# Patient Record
Sex: Female | Born: 1935 | Race: White | Hispanic: No | Smoking: Never smoker
Health system: Southern US, Community
[De-identification: ages and names within clinical notes are randomized; demographics above are authoritative.]

## PROBLEM LIST (undated history)

## (undated) DIAGNOSIS — M419 Scoliosis, unspecified: Secondary | ICD-10-CM

## (undated) DIAGNOSIS — I779 Disorder of arteries and arterioles, unspecified: Secondary | ICD-10-CM

## (undated) DIAGNOSIS — E785 Hyperlipidemia, unspecified: Secondary | ICD-10-CM

## (undated) DIAGNOSIS — I350 Nonrheumatic aortic (valve) stenosis: Secondary | ICD-10-CM

## (undated) DIAGNOSIS — E039 Hypothyroidism, unspecified: Secondary | ICD-10-CM

## (undated) DIAGNOSIS — K219 Gastro-esophageal reflux disease without esophagitis: Secondary | ICD-10-CM

## (undated) DIAGNOSIS — I2510 Atherosclerotic heart disease of native coronary artery without angina pectoris: Secondary | ICD-10-CM

## (undated) DIAGNOSIS — I480 Paroxysmal atrial fibrillation: Secondary | ICD-10-CM

## (undated) DIAGNOSIS — Z87442 Personal history of urinary calculi: Secondary | ICD-10-CM

## (undated) DIAGNOSIS — I10 Essential (primary) hypertension: Secondary | ICD-10-CM

## (undated) DIAGNOSIS — I5032 Chronic diastolic (congestive) heart failure: Secondary | ICD-10-CM

## (undated) HISTORY — DX: Scoliosis, unspecified: M41.9

## (undated) HISTORY — DX: Atherosclerotic heart disease of native coronary artery without angina pectoris: I25.10

## (undated) HISTORY — DX: Paroxysmal atrial fibrillation: I48.0

## (undated) HISTORY — DX: Personal history of urinary calculi: Z87.442

## (undated) HISTORY — DX: Essential (primary) hypertension: I10

## (undated) HISTORY — DX: Nonrheumatic aortic (valve) stenosis: I35.0

## (undated) HISTORY — PX: CATARACT EXTRACTION: SUR2

## (undated) HISTORY — PX: OTHER SURGICAL HISTORY: SHX169

## (undated) HISTORY — DX: Hyperlipidemia, unspecified: E78.5

## (undated) HISTORY — DX: Chronic diastolic (congestive) heart failure: I50.32

## (undated) HISTORY — DX: Disorder of arteries and arterioles, unspecified: I77.9

## (undated) HISTORY — DX: Hypothyroidism, unspecified: E03.9

## (undated) HISTORY — DX: Gastro-esophageal reflux disease without esophagitis: K21.9

---

## 2004-07-28 ENCOUNTER — Ambulatory Visit: Payer: Self-pay | Admitting: Family Medicine

## 2007-01-17 ENCOUNTER — Ambulatory Visit: Payer: Self-pay | Admitting: Family Medicine

## 2007-01-18 ENCOUNTER — Ambulatory Visit: Payer: Self-pay | Admitting: Family Medicine

## 2007-02-15 ENCOUNTER — Ambulatory Visit: Payer: Self-pay | Admitting: Vascular Surgery

## 2008-07-11 ENCOUNTER — Ambulatory Visit: Payer: Self-pay | Admitting: Family Medicine

## 2009-06-28 ENCOUNTER — Inpatient Hospital Stay: Payer: Self-pay | Admitting: Internal Medicine

## 2010-01-15 ENCOUNTER — Ambulatory Visit: Payer: Self-pay | Admitting: Family Medicine

## 2010-01-26 ENCOUNTER — Ambulatory Visit: Payer: Self-pay | Admitting: Family Medicine

## 2010-02-26 ENCOUNTER — Ambulatory Visit: Payer: Self-pay | Admitting: Surgery

## 2010-03-02 ENCOUNTER — Ambulatory Visit: Payer: Self-pay | Admitting: Surgery

## 2010-03-17 ENCOUNTER — Ambulatory Visit: Payer: Self-pay | Admitting: Surgery

## 2010-03-27 ENCOUNTER — Ambulatory Visit: Payer: Self-pay | Admitting: Oncology

## 2010-04-13 ENCOUNTER — Ambulatory Visit: Payer: Self-pay | Admitting: Oncology

## 2010-04-27 ENCOUNTER — Ambulatory Visit: Payer: Self-pay | Admitting: Oncology

## 2010-05-28 ENCOUNTER — Ambulatory Visit: Payer: Self-pay | Admitting: Oncology

## 2010-06-27 ENCOUNTER — Ambulatory Visit: Payer: Self-pay | Admitting: Oncology

## 2010-07-28 ENCOUNTER — Ambulatory Visit: Payer: Self-pay | Admitting: Oncology

## 2010-08-27 ENCOUNTER — Ambulatory Visit: Payer: Self-pay | Admitting: Oncology

## 2011-01-19 ENCOUNTER — Ambulatory Visit: Payer: Self-pay | Admitting: Family Medicine

## 2012-03-07 ENCOUNTER — Ambulatory Visit: Payer: Self-pay | Admitting: Family Medicine

## 2013-03-12 ENCOUNTER — Ambulatory Visit: Payer: Self-pay | Admitting: Family Medicine

## 2013-03-21 ENCOUNTER — Ambulatory Visit: Payer: Self-pay

## 2013-12-12 ENCOUNTER — Ambulatory Visit: Payer: Self-pay | Admitting: Urology

## 2014-01-01 ENCOUNTER — Emergency Department: Payer: Self-pay | Admitting: Emergency Medicine

## 2014-01-01 LAB — BASIC METABOLIC PANEL
Anion Gap: 6 — ABNORMAL LOW (ref 7–16)
BUN: 15 mg/dL (ref 7–18)
CALCIUM: 8 mg/dL — AB (ref 8.5–10.1)
CHLORIDE: 97 mmol/L — AB (ref 98–107)
CREATININE: 0.92 mg/dL (ref 0.60–1.30)
Co2: 27 mmol/L (ref 21–32)
EGFR (African American): >60
EGFR (Non-African Amer.): 58 — ABNORMAL LOW
Glucose: 108 mg/dL — ABNORMAL HIGH (ref 65–99)
OSMOLALITY: 262 (ref 275–301)
Potassium: 3.2 mmol/L — ABNORMAL LOW (ref 3.5–5.1)
SODIUM: 130 mmol/L — AB (ref 136–145)

## 2014-01-01 LAB — CBC
HCT: 36.9 % (ref 35.0–47.0)
HGB: 12.7 g/dL (ref 12.0–16.0)
MCH: 30.7 pg (ref 26.0–34.0)
MCHC: 34.5 g/dL (ref 32.0–36.0)
MCV: 89 fL (ref 80–100)
Platelet: 115 10*3/uL — ABNORMAL LOW (ref 150–440)
RBC: 4.15 10*6/uL (ref 3.80–5.20)
RDW: 13.5 % (ref 11.5–14.5)
WBC: 5.2 10*3/uL (ref 3.6–11.0)

## 2014-01-01 LAB — TSH: Thyroid Stimulating Horm: 2.25 u[IU]/mL

## 2014-01-31 DIAGNOSIS — E039 Hypothyroidism, unspecified: Secondary | ICD-10-CM | POA: Insufficient documentation

## 2014-01-31 DIAGNOSIS — E782 Mixed hyperlipidemia: Secondary | ICD-10-CM | POA: Insufficient documentation

## 2014-01-31 DIAGNOSIS — I10 Essential (primary) hypertension: Secondary | ICD-10-CM | POA: Insufficient documentation

## 2014-02-03 ENCOUNTER — Inpatient Hospital Stay: Payer: Self-pay | Admitting: Internal Medicine

## 2014-02-03 LAB — COMPREHENSIVE METABOLIC PANEL
ALBUMIN: 3.1 g/dL — AB (ref 3.4–5.0)
ALT: 115 U/L — AB (ref 12–78)
Alkaline Phosphatase: 71 U/L
Anion Gap: 3 — ABNORMAL LOW (ref 7–16)
BUN: 11 mg/dL (ref 7–18)
Bilirubin,Total: 0.6 mg/dL (ref 0.2–1.0)
CALCIUM: 8.8 mg/dL (ref 8.5–10.1)
Chloride: 97 mmol/L — ABNORMAL LOW (ref 98–107)
Co2: 31 mmol/L (ref 21–32)
Creatinine: 0.43 mg/dL — ABNORMAL LOW (ref 0.60–1.30)
EGFR (African American): >60
EGFR (Non-African Amer.): >60
GLUCOSE: 133 mg/dL — AB (ref 65–99)
Osmolality: 264 (ref 275–301)
POTASSIUM: 4.5 mmol/L (ref 3.5–5.1)
SGOT(AST): 96 U/L — ABNORMAL HIGH (ref 15–37)
SODIUM: 131 mmol/L — AB (ref 136–145)
TOTAL PROTEIN: 6.9 g/dL (ref 6.4–8.2)

## 2014-02-03 LAB — PROTIME-INR
INR: 0.9
PROTHROMBIN TIME: 11.6 s (ref 11.5–14.7)

## 2014-02-03 LAB — TROPONIN I: Troponin-I: <0.02 ng/mL

## 2014-02-03 LAB — CK TOTAL AND CKMB (NOT AT ARMC)
CK, Total: 71 U/L
CK-MB: 1.2 ng/mL (ref 0.5–3.6)

## 2014-02-03 LAB — CBC
HCT: 41.7 % (ref 35.0–47.0)
HGB: 13.8 g/dL (ref 12.0–16.0)
MCH: 30.6 pg (ref 26.0–34.0)
MCHC: 33.2 g/dL (ref 32.0–36.0)
MCV: 92 fL (ref 80–100)
Platelet: 220 10*3/uL (ref 150–440)
RBC: 4.51 10*6/uL (ref 3.80–5.20)
RDW: 15.2 % — AB (ref 11.5–14.5)
WBC: 6.1 10*3/uL (ref 3.6–11.0)

## 2014-02-03 LAB — TSH: Thyroid Stimulating Horm: 3.91 u[IU]/mL

## 2014-02-03 LAB — PRO B NATRIURETIC PEPTIDE: B-Type Natriuretic Peptide: 7243 pg/mL — ABNORMAL HIGH (ref 0–450)

## 2014-02-03 LAB — APTT: Activated PTT: 25.2 secs (ref 23.6–35.9)

## 2014-02-03 LAB — CK-MB: CK-MB: 2 ng/mL (ref 0.5–3.6)

## 2014-02-04 LAB — CBC WITH DIFFERENTIAL/PLATELET
Basophil #: 0 10*3/uL (ref 0.0–0.1)
Basophil %: 0.1 %
EOS PCT: 0.4 %
Eosinophil #: 0 10*3/uL (ref 0.0–0.7)
HCT: 38.7 % (ref 35.0–47.0)
HGB: 12.8 g/dL (ref 12.0–16.0)
LYMPHS PCT: 6.9 %
Lymphocyte #: 0.4 10*3/uL — ABNORMAL LOW (ref 1.0–3.6)
MCH: 30.4 pg (ref 26.0–34.0)
MCHC: 33.1 g/dL (ref 32.0–36.0)
MCV: 92 fL (ref 80–100)
Monocyte #: 0.8 x10 3/mm (ref 0.2–0.9)
Monocyte %: 13.9 %
Neutrophil #: 4.7 10*3/uL (ref 1.4–6.5)
Neutrophil %: 78.7 %
PLATELETS: 178 10*3/uL (ref 150–440)
RBC: 4.22 10*6/uL (ref 3.80–5.20)
RDW: 15.3 % — ABNORMAL HIGH (ref 11.5–14.5)
WBC: 6 10*3/uL (ref 3.6–11.0)

## 2014-02-04 LAB — BASIC METABOLIC PANEL
Anion Gap: 7 (ref 7–16)
BUN: 12 mg/dL (ref 7–18)
CHLORIDE: 93 mmol/L — AB (ref 98–107)
CREATININE: 0.85 mg/dL (ref 0.60–1.30)
Calcium, Total: 9.5 mg/dL (ref 8.5–10.1)
Co2: 32 mmol/L (ref 21–32)
EGFR (African American): >60
EGFR (Non-African Amer.): >60
Glucose: 95 mg/dL (ref 65–99)
Osmolality: 264 (ref 275–301)
Potassium: 3.6 mmol/L (ref 3.5–5.1)
SODIUM: 132 mmol/L — AB (ref 136–145)

## 2014-02-04 LAB — TROPONIN I

## 2014-02-04 LAB — CK-MB: CK-MB: 2.8 ng/mL (ref 0.5–3.6)

## 2014-02-05 LAB — BASIC METABOLIC PANEL
ANION GAP: 7 (ref 7–16)
BUN: 14 mg/dL (ref 7–18)
CO2: 34 mmol/L — AB (ref 21–32)
Calcium, Total: 9 mg/dL (ref 8.5–10.1)
Chloride: 91 mmol/L — ABNORMAL LOW (ref 98–107)
Creatinine: 0.78 mg/dL (ref 0.60–1.30)
EGFR (African American): >60
EGFR (Non-African Amer.): >60
Glucose: 90 mg/dL (ref 65–99)
Osmolality: 265 (ref 275–301)
Potassium: 2.7 mmol/L — ABNORMAL LOW (ref 3.5–5.1)
Sodium: 132 mmol/L — ABNORMAL LOW (ref 136–145)

## 2014-02-05 LAB — MAGNESIUM
MAGNESIUM: 1.9 mg/dL
Magnesium: 1.8 mg/dL

## 2014-02-05 LAB — CBC WITH DIFFERENTIAL/PLATELET
BASOS PCT: 0.4 %
Basophil #: 0 10*3/uL (ref 0.0–0.1)
EOS PCT: 1.6 %
Eosinophil #: 0.1 10*3/uL (ref 0.0–0.7)
HCT: 37.2 % (ref 35.0–47.0)
HGB: 12.7 g/dL (ref 12.0–16.0)
Lymphocyte #: 0.4 10*3/uL — ABNORMAL LOW (ref 1.0–3.6)
Lymphocyte %: 8.4 %
MCH: 31.4 pg (ref 26.0–34.0)
MCHC: 34.2 g/dL (ref 32.0–36.0)
MCV: 92 fL (ref 80–100)
MONOS PCT: 14.8 %
Monocyte #: 0.7 x10 3/mm (ref 0.2–0.9)
NEUTROS PCT: 74.8 %
Neutrophil #: 3.7 10*3/uL (ref 1.4–6.5)
Platelet: 148 10*3/uL — ABNORMAL LOW (ref 150–440)
RBC: 4.05 10*6/uL (ref 3.80–5.20)
RDW: 15 % — ABNORMAL HIGH (ref 11.5–14.5)
WBC: 4.9 10*3/uL (ref 3.6–11.0)

## 2014-02-05 LAB — POTASSIUM: Potassium: 4 mmol/L (ref 3.5–5.1)

## 2014-02-05 LAB — HEMOGLOBIN A1C: HEMOGLOBIN A1C: 5.6 % (ref 4.2–6.3)

## 2014-02-06 LAB — BASIC METABOLIC PANEL
ANION GAP: 2 — AB (ref 7–16)
BUN: 14 mg/dL (ref 7–18)
Calcium, Total: 8.7 mg/dL (ref 8.5–10.1)
Chloride: 94 mmol/L — ABNORMAL LOW (ref 98–107)
Co2: 36 mmol/L — ABNORMAL HIGH (ref 21–32)
Creatinine: 0.88 mg/dL (ref 0.60–1.30)
EGFR (Non-African Amer.): >60
Glucose: 94 mg/dL (ref 65–99)
Osmolality: 265 (ref 275–301)
Potassium: 4.4 mmol/L (ref 3.5–5.1)
Sodium: 132 mmol/L — ABNORMAL LOW (ref 136–145)

## 2014-02-06 LAB — MAGNESIUM: Magnesium: 2.2 mg/dL

## 2014-02-07 LAB — CREATININE, SERUM
Creatinine: 0.89 mg/dL (ref 0.60–1.30)
EGFR (African American): >60
GFR CALC NON AF AMER: 60 — AB

## 2014-02-07 LAB — SODIUM: Sodium: 134 mmol/L — ABNORMAL LOW (ref 136–145)

## 2014-02-07 LAB — POTASSIUM: POTASSIUM: 4.4 mmol/L (ref 3.5–5.1)

## 2014-02-15 LAB — COMPREHENSIVE METABOLIC PANEL
ANION GAP: 4 — AB (ref 7–16)
Albumin: 3.2 g/dL — ABNORMAL LOW (ref 3.4–5.0)
Alkaline Phosphatase: 98 U/L
BUN: 16 mg/dL (ref 7–18)
Bilirubin,Total: 0.5 mg/dL (ref 0.2–1.0)
Calcium, Total: 9.3 mg/dL (ref 8.5–10.1)
Chloride: 102 mmol/L (ref 98–107)
Co2: 27 mmol/L (ref 21–32)
Creatinine: 0.78 mg/dL (ref 0.60–1.30)
Glucose: 149 mg/dL — ABNORMAL HIGH (ref 65–99)
Osmolality: 270 (ref 275–301)
Potassium: 4.6 mmol/L (ref 3.5–5.1)
SGOT(AST): 109 U/L — ABNORMAL HIGH (ref 15–37)
SGPT (ALT): 112 U/L — ABNORMAL HIGH (ref 12–78)
Sodium: 133 mmol/L — ABNORMAL LOW (ref 136–145)
Total Protein: 7.4 g/dL (ref 6.4–8.2)

## 2014-02-15 LAB — CBC
HCT: 42.3 % (ref 35.0–47.0)
HGB: 13.8 g/dL (ref 12.0–16.0)
MCH: 30.7 pg (ref 26.0–34.0)
MCHC: 32.6 g/dL (ref 32.0–36.0)
MCV: 94 fL (ref 80–100)
Platelet: 186 10*3/uL (ref 150–440)
RBC: 4.5 10*6/uL (ref 3.80–5.20)
RDW: 15 % — ABNORMAL HIGH (ref 11.5–14.5)
WBC: 6.6 10*3/uL (ref 3.6–11.0)

## 2014-02-15 LAB — TROPONIN I: Troponin-I: <0.02 ng/mL

## 2014-02-15 LAB — CK TOTAL AND CKMB (NOT AT ARMC)
CK, Total: 68 U/L
CK-MB: 1.4 ng/mL (ref 0.5–3.6)

## 2014-02-16 ENCOUNTER — Inpatient Hospital Stay: Payer: Self-pay | Admitting: Internal Medicine

## 2014-02-16 LAB — COMPREHENSIVE METABOLIC PANEL
ANION GAP: 3 — AB (ref 7–16)
Albumin: 3 g/dL — ABNORMAL LOW (ref 3.4–5.0)
Alkaline Phosphatase: 88 U/L
BUN: 15 mg/dL (ref 7–18)
Bilirubin,Total: 0.5 mg/dL (ref 0.2–1.0)
CREATININE: 0.84 mg/dL (ref 0.60–1.30)
Calcium, Total: 8.8 mg/dL (ref 8.5–10.1)
Chloride: 107 mmol/L (ref 98–107)
Co2: 32 mmol/L (ref 21–32)
EGFR (African American): >60
EGFR (Non-African Amer.): >60
GLUCOSE: 103 mg/dL — AB (ref 65–99)
OSMOLALITY: 284 (ref 275–301)
Potassium: 3.9 mmol/L (ref 3.5–5.1)
SGOT(AST): 68 U/L — ABNORMAL HIGH (ref 15–37)
SGPT (ALT): 99 U/L — ABNORMAL HIGH (ref 12–78)
SODIUM: 142 mmol/L (ref 136–145)
TOTAL PROTEIN: 5.9 g/dL — AB (ref 6.4–8.2)

## 2014-02-16 LAB — TROPONIN I
Troponin-I: <0.02 ng/mL
Troponin-I: <0.02 ng/mL

## 2014-02-16 LAB — URINALYSIS, COMPLETE
BLOOD: NEGATIVE
Bacteria: NONE SEEN
Bilirubin,UR: NEGATIVE
GLUCOSE, UR: NEGATIVE mg/dL (ref 0–75)
KETONE: NEGATIVE
LEUKOCYTE ESTERASE: NEGATIVE
NITRITE: NEGATIVE
Ph: 7 (ref 4.5–8.0)
RBC,UR: 1 /HPF (ref 0–5)
SQUAMOUS EPITHELIAL: NONE SEEN
Specific Gravity: 1.009 (ref 1.003–1.030)

## 2014-02-16 LAB — CK TOTAL AND CKMB (NOT AT ARMC)
CK, Total: 26 U/L
CK, Total: 14 U/L — ABNORMAL LOW
CK-MB: 1.6 ng/mL (ref 0.5–3.6)
CK-MB: 1.1 ng/mL (ref 0.5–3.6)

## 2014-02-16 LAB — PRO B NATRIURETIC PEPTIDE: B-TYPE NATIURETIC PEPTID: 9758 pg/mL — AB (ref 0–450)

## 2014-02-16 LAB — HEMOGLOBIN A1C: Hemoglobin A1C: 5.8 % (ref 4.2–6.3)

## 2014-02-17 LAB — CBC WITH DIFFERENTIAL/PLATELET
BASOS PCT: 0.7 %
Basophil #: 0 10*3/uL (ref 0.0–0.1)
Eosinophil #: 0.1 10*3/uL (ref 0.0–0.7)
Eosinophil %: 2.6 %
HCT: 37.6 % (ref 35.0–47.0)
HGB: 12.4 g/dL (ref 12.0–16.0)
Lymphocyte #: 0.5 10*3/uL — ABNORMAL LOW (ref 1.0–3.6)
Lymphocyte %: 12.3 %
MCH: 30.6 pg (ref 26.0–34.0)
MCHC: 33 g/dL (ref 32.0–36.0)
MCV: 93 fL (ref 80–100)
MONO ABS: 0.7 x10 3/mm (ref 0.2–0.9)
Monocyte %: 14.8 %
NEUTROS ABS: 3.1 10*3/uL (ref 1.4–6.5)
NEUTROS PCT: 69.6 %
Platelet: 168 10*3/uL (ref 150–440)
RBC: 4.05 10*6/uL (ref 3.80–5.20)
RDW: 14.9 % — ABNORMAL HIGH (ref 11.5–14.5)
WBC: 4.4 10*3/uL (ref 3.6–11.0)

## 2014-02-17 LAB — COMPREHENSIVE METABOLIC PANEL
ALBUMIN: 2.6 g/dL — AB (ref 3.4–5.0)
ALT: 73 U/L (ref 12–78)
AST: 42 U/L — AB (ref 15–37)
Alkaline Phosphatase: 74 U/L
Anion Gap: 3 — ABNORMAL LOW (ref 7–16)
BUN: 16 mg/dL (ref 7–18)
Bilirubin,Total: 0.6 mg/dL (ref 0.2–1.0)
CHLORIDE: 103 mmol/L (ref 98–107)
Calcium, Total: 8.9 mg/dL (ref 8.5–10.1)
Co2: 33 mmol/L — ABNORMAL HIGH (ref 21–32)
Creatinine: 0.82 mg/dL (ref 0.60–1.30)
EGFR (African American): >60
EGFR (Non-African Amer.): >60
GLUCOSE: 80 mg/dL (ref 65–99)
Osmolality: 278 (ref 275–301)
Potassium: 3.6 mmol/L (ref 3.5–5.1)
SODIUM: 139 mmol/L (ref 136–145)
Total Protein: 5.9 g/dL — ABNORMAL LOW (ref 6.4–8.2)

## 2014-02-17 LAB — D-DIMER(ARMC): D-Dimer: 1223 ng/ml

## 2014-02-17 LAB — URINE CULTURE

## 2014-02-17 LAB — LIPID PANEL
Cholesterol: 168 mg/dL (ref 0–200)
HDL: 40 mg/dL (ref 40–60)
LDL CHOLESTEROL, CALC: 109 mg/dL — AB (ref 0–100)
Triglycerides: 97 mg/dL (ref 0–200)
VLDL Cholesterol, Calc: 19 mg/dL (ref 5–40)

## 2014-02-20 DIAGNOSIS — I2510 Atherosclerotic heart disease of native coronary artery without angina pectoris: Secondary | ICD-10-CM | POA: Insufficient documentation

## 2014-09-11 ENCOUNTER — Ambulatory Visit: Payer: Self-pay | Admitting: Family Medicine

## 2014-10-16 DIAGNOSIS — I482 Chronic atrial fibrillation, unspecified: Secondary | ICD-10-CM | POA: Insufficient documentation

## 2014-10-16 DIAGNOSIS — I429 Cardiomyopathy, unspecified: Secondary | ICD-10-CM | POA: Insufficient documentation

## 2014-10-16 DIAGNOSIS — I071 Rheumatic tricuspid insufficiency: Secondary | ICD-10-CM | POA: Insufficient documentation

## 2014-10-16 DIAGNOSIS — I340 Nonrheumatic mitral (valve) insufficiency: Secondary | ICD-10-CM | POA: Insufficient documentation

## 2014-10-16 DIAGNOSIS — I119 Hypertensive heart disease without heart failure: Secondary | ICD-10-CM | POA: Insufficient documentation

## 2014-10-16 DIAGNOSIS — I272 Pulmonary hypertension, unspecified: Secondary | ICD-10-CM | POA: Insufficient documentation

## 2015-01-14 DIAGNOSIS — M810 Age-related osteoporosis without current pathological fracture: Secondary | ICD-10-CM | POA: Insufficient documentation

## 2015-01-18 NOTE — Discharge Summary (Signed)
PATIENT NAME:  April Barrett, April Barrett DATE OF BIRTH:  September 16, 1928  DATE OF ADMISSION:  02/03/2014 DATE OF DISCHARGE:  02/07/2014   ADMITTING DIAGNOSIS: Shortness of breath.   DISCHARGE DIAGNOSES:  1. Acute respiratory failure felt to be multifactorial, including acute systolic congestive heart failure, possible chronic obstructive pulmonary disease exacerbation, possible atypical pneumonia.  2. Acute on chronic systolic congestive heart failure.  3. Acute on chronic obstructive pulmonary disease exacerbation.  4. Possible pneumonia.  5. Hyponatremia due to diuresis.  6. Gastroesophageal reflux disease.  7. Hypothyroidism.  8. Hypertension.  9. Cataracts.  10. Coronary artery disease per cardiac catheterization in 2010, with severe disease, not amenable to stenting.  CONSULTANTS: Marcina MillardAlexander Paraschos, MD  PERTINENT LABORATORIES AND EVALUATIONS: Troponin less than 0.02, CPK 71, CK-MB 12. INR 0.9. WBC 6.1, hemoglobin 13.8, BUN 11, creatinine 0.43. Chest x-ray showed atelectasis with bilateral infiltrates. EKG: Atrial fibrillation with RVR. Most recent sodium is 134. Creatinine on May 13th is 0.88. Ultrasound of the lower extremities showed no DVT. Echocardiogram of the heart showed mildly decreased systolic function, 40% to 45%, mild left ventricular hypertrophy, mildly dilated left atrium, moderate mitral valve regurgitation, mild aortic stenosis, moderate tricuspid regurgitation, moderately elevated pulmonary systolic pressure.   HOSPITAL COURSE: Please refer to H and P done by the admitting physician. The patient is an 81 year old white female with history of atrial fibrillation, coronary artery disease, history of congestive heart failure, who presented with complaint of shortness of breath. Was noted to be in atrial fibrillation with RVR. The patient was treated for her atrial fibrillation with RVR, and subsequently, her heart rate improved. She was also noticed to have systolic  CHF, which was treated with diuresis. The patient was also thought to have possible pneumonia, which was treated with Levaquin. She also was noted to have some wheezing and thought to have acute COPD exacerbation, which was treated with nebulizers. The patient is doing much better. Was seen by cardiology during the hospitalization. At this point, she is hemodynamically stable for discharge.   DISCHARGE MEDICATIONS:  1. Diltiazem 120 mg daily.  2. Vitamin D3 1000 international units daily. 3. Tums 500 two tabs 4 times a day. 4. Klor-Con 10 mEq daily.  5. Metoprolol tartrate 100 mg 1 tab p.o. b.i.d. 6. Nexium 40 daily. 7. Levothyroxine 100 mcg daily for 6 days.  8. Vitamin B12 500 mcg daily.  9. Aspirin 81 one tab p.o. daily. 10. Colace 300 mg daily. 11. Ventolin 2 puffs 4 times a day as needed for wheezing.  12. Levothyroxine 100 mcg daily.  13. Levofloxacin 750 one tab p.o. every 48 hours for 6 days. 14. Lasix 20 one tab p.o. b.i.d.   HOME OXYGEN: Yes, continuous at 1 liter nasal cannula.   DIET: Low sodium, low fat, low cholesterol.   ACTIVITY: As tolerated.  FOLLOWUP: Timeframe for followup with primary MD in 1 to 2 weeks. [facility] in 2 to 4 weeks. The patient to have a BMP checked at the time of visit to her primary care provider. Also, the patient to wear TED hose for the lower extremity at all times.    TIME SPENT: 35 minutes.   ____________________________ Lacie ScottsShreyang H. Allena KatzPatel, MD shp:lb D: 02/07/2014 09:55:32 ET T: 02/07/2014 10:08:12 ET JOB#: [id number]  cc: Claiborne Stroble H. Allena KatzPatel, MD, <Dictator> Charise CarwinSHREYANG H Natividad Halls MD ELECTRONICALLY SIGNED 02/08/2014 8:32

## 2015-01-18 NOTE — Discharge Summary (Signed)
PATIENT NAME:  April Barrett, April Barrett MR#:  [id number] DATE OF BIRTH:  [birth year]  DATE OF ADMISSION:  02/16/2014 DATE OF DISCHARGE:  02/18/2014  ADMITTING DIAGNOSES: Atrial fibrillation, rapid ventricular response, congestive heart failure.   DISCHARGE DIAGNOSES:  1.  Acute on chronic combined systolic and diastolic congestive heart failure.  2.  Atrial fibrillation, rapid ventricular response.  3.  Elevated transaminases, likely passive liver congestion, improved with congestive heart failure treatment. 4.  History of chronic obstructive pulmonary disease, stable.  5.  Coronary artery disease, stable.  6.  Chronic atrial fibrillation. 7.  Hypothyroidism. 8.  Hypertension.  9.  Cataracts. 10.  Gastroesophageal reflux disease. 11.  Known cardiomyopathy with ejection fraction of 40% to 45% with moderate left ventricular hypertrophy, moderate tricuspid regurgitation, as well as mitral regurgitation, and mild aortic regurgitation and elevated pulmonary pressures. 12.  Acute respiratory failure due to acute on chronic combined congestive heart failure requiring oxygen therapy.   DISCHARGE CONDITION: Stable.   DISCHARGE MEDICATIONS: The patient is to resume:  1.  Vitamin D3 1000 units once daily. 2.  TUMS 500 mg 2 tablets 4 times daily. 3.  Metoprolol tartrate 100 mg p.o. twice daily. 4.  Nexium 40 mg p.o. once daily. 5.  Levothyroxine 100 mcg p.o. daily. 6.  Vitamin B12 500 mcg p.o. daily.  7.  Aspirin 81 mg p.o. daily. 8.  Docusate sodium 100 mg 3 capsules once daily.  9.  Ventolin CFC free 2 puffs 4 times daily as needed.  10.  Diltiazem extended release 180 mg p.o. once daily, this is a new dose. 11.  Diltiazem short-acting 30 mg p.o. every 6 hours as needed for heart rate more than 100 and to be held for systolic blood pressure below 045100.  12.  Furosemide 40 mg p.o. twice daily. 13.  Potassium chloride 20 mEq twice daily. 14.  Benzocaine menthol topical lozenges every 2 hours as needed  for cough as well as sore throat and poor taste in mouth.  HOME OXYGEN: Portable tank at 2 liters of oxygen through nasal cannula to be weaned as tolerated following oxygen saturations on exertion.  DISCHARGE DIET: Two gram salt, low fat, low cholesterol, regular consistency.   ACTIVITY LIMITATIONS: As tolerated.   FOLLOWUP APPOINTMENT: With Dr. Burnett ShengHedrick in 2 days after discharge, Dr. Lady GaryFath in 1 week after discharge. The patient is being referred to physical therapy in skilled nursing facility and being discharged to skilled nursing facility for oxygen wean.   CONSULTANTS: Care management, social work, Dr. Lady GaryFath.   DIAGNOSTIC DATA: Radiologic studies: Chest x-ray, PA and lateral, 23rd of May 2015, revealed improving pleural effusions, persistent pulmonary edema. CT angiogram of chest to rule out pulmonary embolism, 24th of May 2015, revealed no evidence of pulmonary embolus, combination of mild cardiomegaly, moderate right as well as small left pleural effusions and interstitial thickening with hazy areas of relative increased parenchymal attenuation consistent with congestive heart failure with mild pulmonary edema. There is also bibasilar dependent atelectasis. Ultrasound of abdomen, limited survey, 24th of May 2015, showed no definite abnormality seen in the right upper quadrant of the abdomen. Incidental note was made of right pleural effusion.   HISTORY AND HOSPITAL COURSE: The patient is an 81 year old Caucasian female with past medical history significant for history of chronic A-fib who presented to the hospital on the 23rd of May 2015 for shortness of breath as well as 1 pound weight gain in 1 day as well as increased heart rate. Please refer  to Dr. Rob Hickman admission note on the 23rd of May 2015. On arrival to the hospital, the patient'Barrett heart rate was around 140. She was also noted to have worsening pulmonary edema. Her vital signs: Temperature was 97.5, pulse was 104 to 140, respiration rate  was initially 22, blood pressure 125/62, and O2 sats were 96% on oxygen therapy. Physical exam revealed 1+ pitting lower extremity edema, distant breath sounds and rales at bases on lung exam. The patient'Barrett EKG showed A-fib with rapid ventricular response and nonspecific ST-T changes. The patient'Barrett lab data done on the day of admission revealed elevated beta-type natriuretic peptide of 9758 and glucose 149, otherwise, BMP was unremarkable. The patient'Barrett liver enzymes showed albumin level of 3.2, AST as well as ALT 109 and 112, respectively. Cardiac enzymes x3 were within normal limits and CBC was within normal limits with white blood cell count 6.6, hemoglobin 13.8, and platelet count 186,000. Urinalysis was performed and urine culture which showed no growth and urinalysis was unremarkable.   The patient was admitted to the hospital for further evaluation. She was restarted on her medications to control heart rate. She was initiated on Cardizem adding Cardizem IV as needed as well as metoprolol. With this combination her heart rate somewhat improved, however not significantly. The patient'Barrett Cardizem was advanced to 180 mg p.o. daily dose. With this combination, her heart rate improved to 80s and 100s. It was felt that the patient'Barrett control was quite satisfactory with this combination. The patient was diuresed with IV Lasix and her IV Lasix was changed to oral upon discharge. It is recommended to follow the patient'Barrett weight as well as her lung condition as well as her oxygenation closely and make decisions about advancement or decreasing her medications depending on her needs. The patient is to follow up with cardiologist as outpatient for further recommendations.   In regards to COPD, coronary artery disease, hypothyroidism, and hypertension the patient is to continue her outpatient management. No changes were made.   The patient is being discharged in stable condition with the above-mentioned medications and  follow-up. On the day of discharge, temperature was 98, pulse was 79, respiratory rate was 8 to 16, blood pressure 130/69, and saturation 96% on room air at rest.   TIME SPENT: 40 minutes.   ____________________________ Katharina Caper, MD rv:sb D: 02/18/2014 10:22:55 ET T: 02/18/2014 10:53:48 ET JOB#: [id number]  cc: Katharina Caper, MD, <Dictator> Jashon Ishida MD ELECTRONICALLY SIGNED 02/28/2014 19:59

## 2015-01-18 NOTE — Consult Note (Signed)
PATIENT NAME:  April Barrett, April Barrett MR#:  [id number] DATE OF BIRTH:  [birth year]  DATE OF CONSULTATION:  02/03/2014  REFERRING PHYSICIAN:   CONSULTING PHYSICIAN:  Marcina MillardAlexander Elsye Mccollister, MD  PRIMARY CARE PHYSICIAN:  Dr. Burnett ShengHedrick   CARDIOLOGIST:  Dr. Gwen PoundsKowalski.   CHIEF COMPLAINT: Shortness of breath.   HISTORY OF PRESENT ILLNESS: The patient is an 81 year old female with history of coronary artery disease, diastolic congestive heart failure, atrial fibrillation, hypertension. The patient presents with a 10-day history of increasing peripheral edema and dyspnea on exertion. In the Emergency Room, she was noted to be in atrial fibrillation with a rapid ventricular rate and was treated with intravenous diltiazem bolus. The patient was also treated with intravenous furosemide with diuresis and overall clinical improvement. Admission labs were notable for negative troponin of less than 0.02. The patient reports feeling much better today. Denies chest pain.   PAST MEDICAL HISTORY:  1. Coronary artery disease with both left main and right coronary artery disease, not felt to be amenable to percutaneous coronary intervention. 2. Diastolic congestive heart failure.  3. Atrial fibrillation.  4. Hypertension.  5. Gastroesophageal reflux disease.  6. Hypothyroidism.  7. History of cataracts.   MEDICATIONS: Metoprolol 100 mg daily, diltiazem 120 mg daily, aspirin 81 mg daily, Nexium 40 mg daily, Synthroid 100 mcg daily, vitamin D 1 daily, calcium 1 daily, vitamin B12 500 mg daily.   SOCIAL HISTORY: The patient currently lives by herself. She denies tobacco abuse.   FAMILY HISTORY: Positive for coronary artery disease.    REVIEW OF SYSTEMS:  CONSTITUTIONAL: No fever or chills.  EYES: No blurry vision.  EARS: No hearing loss.  RESPIRATORY: The patient has shortness of breath as described above.  CARDIOVASCULAR: The patient denies chest pain.  GASTROINTESTINAL: No nausea, vomiting, or diarrhea.   GENITOURINARY: No dysuria or hematuria.  ENDOCRINE: No polyuria or polydipsia.  INTEGUMENTARY: No rash.  MUSCULOSKELETAL: No arthralgias or myalgias.  NEUROLOGICAL: No focal muscle weakness or numbness.  PSYCHOLOGICAL: No depression or anxiety.   PHYSICAL EXAMINATION:  VITAL SIGNS: Blood pressure 159/93, pulse 86, respirations 20, temperature 97.7, pulse oximetry 98%.  HEENT: Pupils equal, reactive to light and accommodation.  NECK: Supple without thyromegaly.  LUNGS: Clear.  CARDIOVASCULAR: Normal JVP. Diffuse PMI. Regular rate and rhythm. Normal S1, S2. No appreciable gallop, murmur, or rub.  ABDOMEN: Soft, nontender. Pulses were intact bilaterally.  MUSCULOSKELETAL: Normal muscle tone.  NEUROLOGIC: The patient is alert and oriented x 3. Motor and sensory both grossly intact.   IMPRESSION: An 81 year old female with known coronary artery disease, diastolic congestive heart failure who presents with 10-day history of increasing shortness of breath and symptoms consistent with congestive heart failure. The patient has ruled out for myocardial infarction by CPK, isoenzymes, and troponin. Blood pressure is still mildly elevated. Patient has normal renal function. 1. Agree with overall current therapy.  2. Would defer full dose anticoagulation.  3. Would add a low-dose ACE inhibitor in light of elevated blood pressure, congestive heart failure, and normal renal function.  4. Review 2-D echocardiogram.  5. Further recommendations pending echocardiogram results.    ____________________________ Marcina MillardAlexander Corde Antonini, MD ap:dd D: 02/04/2014 17:42:04 ET T: 02/04/2014 19:33:39 ET JOB#: [id number]  cc: Marcina MillardAlexander Wilmoth Rasnic, MD, <Dictator> Marcina MillardALEXANDER Reise Gladney MD ELECTRONICALLY SIGNED 02/25/2014 15:03

## 2015-01-18 NOTE — H&P (Signed)
PATIENT NAME:  April Barrett, April Barrett MR#:  [id number] DATE OF BIRTH:  May 21, 1928  DATE OF ADMISSION:  02/16/2014  PRIMARY CARE PHYSICIAN: Dr. Tera MaterJim Hedrick   REFERRING PHYSICIAN: Emergency Room physician, Dr. Dolores FrameSung.   CHIEF COMPLAINT: Shortness of breath, 1 pound weight gain in one day and fast heart rate.  HISTORY OF PRESENT ILLNESS: The patient is an 81 year old, pleasant Caucasian female with a past medical history of congestive heart failure and multiple other medical problems, who is presenting to the ER with a chief complaint of shortness of breath. The patient was just recently admitted to the hospital on Feb 03, 2014, with a similar complaint of shortness of breath. The patient is sent over from a nursing home because of shortness of breath for two days. Denies any chest pain or tightness. Denies any wheezing. She reports that she has gained 1 pound of weight in a day. Complaining of swelling in her feet. Nursing home called EMS, and the patient was brought into the ER. The patient has chronic history of atrial fibrillation, and her heart rate was around 130s to 140s by the time she arrived. The patient was given Solu-Medrol 125 mg IV and has received Cardizem IV, which eventually decreased the heart rate to 110. The patient also has received Lasix IV by the ER physician, in view of pulmonary edema. During my examination, the patient is resting comfortably. Denies any complaints. No family members at bedside.    REVIEW OF SYSTEMS: CONSTITUTIONAL: Denies any fever, fatigue or weakness.  EYES: Denies blurry vision, double vision. ENT: Denies epistaxis, discharge. No tinnitus.  RESPIRATORY: Complaining of cough. No wheezing, hemoptysis. Complaining of dyspnea. No history of chronic obstructive pulmonary disease.  CARDIOVASCULAR: No chest pain. Has a chronic atrial fibrillation. Comes with palpitations. Denies any syncope.  GASTROINTESTINAL: Denies nausea, vomiting, diarrhea, abdominal pain, melena, or  peptic ulcer disease.  GENITOURINARY: No dysuria or hematuria.  ENDOCRINE: Denies polyuria, nocturia, heat or cold intolerance.  HEMATOLOGIC AND LYMPHATIC: Complaining of easy bruising. Denies any bleeding or anemia.  SKIN: No rashes. No lesions.  MUSCULOSKELETAL: No joint effusion. No redness, gout. No limited activity.  NEUROLOGIC: Denies any history of vertigo or ataxia.  PSYCHIATRIC: No history of anxiety or depression.   PAST MEDICAL HISTORY: Coronary artery disease. Had cardiac catheter in the year 2010, has revealed RCA and left main disease but not amenable for stents, congestive heart failure, diastolic, chronic atrial fibrillation, on baby aspirin, hypothyroidism, hypertension, cataracts, GERD, hypothyroidism.  PAST SURGICAL HISTORY: Cataract surgery, status post cardiac cath.   ALLERGIES: STATIN CAUSES SWELLING. ACE INHIBITOR, SWELLING. NORVASC, SWELLING.  PSYCHOSOCIAL HISTORY: Lives in a nursing home. Denies smoking, alcohol or illicit drug usage.   FAMILY HISTORY: Heart disease runs in her family.   HOME MEDICATIONS: Vitamin D3 1000 international units 1 tablet p.o. once daily, vitamin B12 500 mcg 1 tablet once daily, Ventolin 2 puffs inhalation 4 times a day, Tums 500 mg 4 times a day, Nexium 40 mg once daily, metoprolol tartrate 100 mg one p.o. b.i.d., levothyroxine 100 mcg p.o. once daily, levofloxacin p.o. q.48 h., Klor-Con 10 mg p.o. once daily, furosemide 20 mg p.o. 2 times a day, Colace 100 mg 1 capsule p.o. once a day, diltiazem 120 mg 1 capsule p.o. once daily.   PHYSICAL EXAMINATION: VITAL SIGNS: Temperature 97.5, pulse 101, respirations 22, blood pressure 125/62, pulse oximetry is 96%.  GENERAL APPEARANCE: Not in acute distress. Moderately built and nourished.  HEENT: Normocephalic, atraumatic. Pupils are equally reacting  to light and accommodation. No scleral icterus. No conjunctival injection. No sinus tenderness. No postnasal drip. Moist mucous membranes.  NECK:  Supple. No JVD. No thyromegaly. Range of motion is intact.  LUNGS: Moderate air entry. Distant breath sounds. Minimal rales at the bases.  CARDIOVASCULAR: Irregularly irregular. Tachycardic. No murmur.  GASTROINTESTINAL: Soft. Bowel sounds are positive in all four quadrants. Nontender, nondistended. No hepatosplenomegaly. No masses.  NEUROLOGIC: Awake, alert, oriented x3. Cranial nerves II through XII are intact. Motor and sensory are intact. Reflexes are 2+.  EXTREMITIES: 1+ pitting edema is present. No cyanosis. No clubbing.  SKIN: Warm to touch. Normal turgor. No rashes. No lesions.  MUSCULOSKELETAL: No joint effusion, tenderness, or erythema.  PSYCHIATRIC: Normal mood and affect.   LABORATORIES AND IMAGING STUDIES: CK total 68, CPK-MB 1.4. Troponin less than 0.02. CBC is normal. Urinalysis: Yellow in color, nitrites and leukocyte esterase are negative. LFTs: Albumin at 3.2, AST and ALT are elevated at 109 and 112, respectively. Glucose 149, BNP 9758. BUN and creatinine are normal. Sodium 133, potassium 4.6, chloride 102, CO2 of 27. GFR greater than 60. Anion gap 4. Serum osmolality and calcium are normal. EKG has revealed atrial fibrillation with rapid ventricular response. Portable chest x-ray, PA and lateral views, involving improving pleural effusions, persistent pulmonary edema.   ASSESSMENT AND PLAN: An 81 year old Caucasian female, brought into the Emergency Room with a chief complaint of shortness of breath, 1 pound weight gain in a day. Will be admitted with the following assessment and plan.   1.  Acute respiratory distress, probably from acute exacerbation of diastolic congestive heart failure and acute on chronic atrial fibrillation with rapid ventricular response. We will admit her to a telemetry bed, continue metoprolol, provide her IV Lasix. Cardiology consult is placed to Dr. Briscoe Burns group.  2.  Acute on chronic atrial fibrillation with rapid ventricular response. We will resume  her home medication, Cardizem. We will provide IV Cardizem as needed for heart rate greater than 120, and, if necessary, will up-titrate medications. We will provide baby aspirin for anticoagulation. 3.  History of chronic obstructive pulmonary disease, not in exacerbation. We will resume her home medication.  4.  Coronary artery disease. Continue beta blocker, statin and aspirin.  5.  Questionable diabetes mellitus. The patient denies any history of diabetes, not on any diabetic medications. Will check hemoglobin A1c.   CODE STATUS: Do not resuscitate.   Diagnosis and plan of care was discussed in detail with the patient. She is aware of the plan.  TOTAL TIME SPENT ON ADMISSION: 50 minutes.      ____________________________ Ramonita Lab, MD ag:cg D: 02/16/2014 02:24:05 ET T: 02/16/2014 04:20:58 ET JOB#: [id number]  cc: Ramonita Lab, MD, <Dictator> Marcina Millard, MD Rhona Leavens. Burnett Sheng, MD  Ramonita Lab MD ELECTRONICALLY SIGNED 02/28/2014 7:56

## 2015-01-18 NOTE — H&P (Signed)
PATIENT NAME:  April Barrett, April Barrett MR#:  [id number] DATE OF BIRTH:  Dec 30, 1927  DATE OF ADMISSION:  02/03/2014  PRIMARY CARE PHYSICIAN: Dr. Kary Kos.   PRIMARY CARDIOLOGIST:  Dr. Nehemiah Massed.    CHIEF COMPLAINT: Shortness of breath for the past 10 days.   HISTORY OF PRESENT ILLNESS: This is an 81 year old female who presents with shortness of breath for the past 10 days with increasing lower extremity edema and dyspnea on exertion. She was noted to be in atrial fibrillation/RVR. Received 10 of diltiazem. Her heart rate went down to 96. She was also given IV Lasix due to elevated BNP and signs of congestive heart failure.    REVIEW OF SYSTEMS:   CONSTITUTIONAL: No fever. Positive fatigue and weakness.  EYES:  No blurred or double vision.  ENT:  No ear pain, hearing. Positive seasonal allergies. No snoring.  RESPIRATORY: Positive cough. No wheezing, hemoptysis. Positive dyspnea. No history of COPD.  CARDIOVASCULAR: No chest pain. Positive orthopnea. Positive edema. Positive dyspnea on exertion. Positive palpitations. No syncope.  GASTROINTESTINAL: No nausea, vomiting, diarrhea, abdominal pain, melena or ulcers.  GENITOURINARY: No dysuria or hematuria.  ENDOCRINE: No polyuria or polydipsia.  HEMATOLOGIC/LYMPHATIC: Positive easy bruising.  SKIN: No rash or lesions.  MUSCULOSKELETAL: No redness or gout. No limited activity.  NEUROLOGIC: No history of CVA.  PSYCHIATRIC: No history of anxiety or depression.   PAST MEDICAL HISTORY:   1.  GERD.  2.  Hypothyroidism.  3.  Coronary artery disease, left cath in 2010 which showed RCA and left main disease but not amenable for stents.  4.  Congestive heart failure, diastolic in nature.  5.  Atrial fibrillation.  6.  Hypothyroidism.  7.  Hypertension.  8.  Cataracts.   PAST SURGICAL HISTORY: Cataract surgery.   ALLERGIES:  1.  STATINS CAUSE SWELLING.  2.  ACE INHIBITOR SWELLING.  3.  NORVASC, LEG SWELLING.   MEDICATIONS: 1.  Vitamin D 1 tablet  daily.  2.  Calcium daily.  3.  The patient was recently taken off her Lasix 20 mg on 05/08, unknown reason.  4.  The patient was also taken off her Coumadin in February, unknown reason.  5.  Metoprolol 100 mg daily.  6.  Nexium 40 mg daily.  7.  Synthroid 100 mcg daily.  8.  Diltiazem 120 mg daily.  9.  Vitamin B12 at 500 mg daily.  10.  Aspirin 81 mg daily.  11.  Stool softeners 3 tablets daily.    SOCIAL HISTORY: No tobacco, alcohol or drug use. She lives by herself.   FAMILY HISTORY: Positive for heart disease.   PHYSICAL EXAMINATION: VITAL SIGNS: Temperature 97.5, pulse 109, respirations 24, blood pressure 135/69, 98% on 2 liters.  GENERAL: The patient is alert, oriented, in mild distress.  HEENT: Head is atraumatic. Pupils are round and reactive. Sclerae anicteric.  Mucous membranes are moist.  Oropharynx is clear.   NECK: Supple without JVD, carotid bruit, or enlarged thyroid.  CARDIOVASCULAR: Irregularly irregular with 2/6 systolic ejection murmur heard best at the right sternal border without radiation.  LUNGS: Clear to auscultation without crackles, rales, rhonchi, or wheezing. Normal percussion. ABDOMEN: Bowel sounds are positive. Nontender, nondistended. No hepatosplenomegaly.  EXTREMITIES: She has 2+ edema, left is greater than right.  NEUROLOGIC: Cranial nerves II through XII intact. No focal deficits.  SKIN: Without rashes or lesions.   LABORATORIES: Troponin less than 0.02. CK 71, CPK-MB 12, INR 0.9. White blood cells  6.1, hemoglobin 13.8, hematocrit 42, platelets are  220. Sodium 131, potassium 4.5, chloride 97, bicarb normal, BUN 11, creatinine 0.43 glucose 133, bilirubin 0.6, alk phos 71, ALT 115, AST 96, serum protein 6.9, albumin 3.1. BNP is 7243.   Chest x-ray shows atelectasis versus infiltrates at the lung bases.   EKG is afib, RVR. Heart rate at that time was 130.   ASSESSMENT AND PLAN: An 81 year old female with a history of diastolic heart failure, coronary  artery disease with disease in the left anterior descending and right coronary artery not amenable to stents, atrial fibrillation, no longer on Coumadin who presents with shortness of breath.   1.  Acute respiratory failure secondary to atrial fibrillation and rapid ventricular response and acute chronic obstructive pulmonary disease exacerbation and possible community-acquired pneumonia. Plan as outlined below.  2.  Atrial fibrillation, rapid ventricular response. The patient's heart rates are improved. We will continue her diltiazem and metoprolol. I have written for intravenous as needed metoprolol and we will continue to follow. We ordered an echocardiogram as we do not have an echocardiogram  recently and also check a TSH and have cardiology consultation regarding her medications.  3.  Congestive heart failure, likely diastolic in nature. May also have a systolic component. We will order an echocardiogram to evaluate her systolic function and provide intravenous Lasix inputs and outputs, daily weights and we will monitor closely.  4.  Community-acquired pneumonia. The patient's x-rays are consistent with this. She was recently about 6 weeks ago complaining of congestion and at that time was given some antibiotics for bronchitis.  I will treat her with Levaquin as this can be contributing to her shortness of breath, atrial fibrillation and congestive heart failure exacerbation.  5.  History of coronary artery disease. The patient apparently had a catheterization in 2010 with severe disease but not amenable to stents and as she is allergic statins so for now, we will continue her on aspirin and metoprolol.  6.  Hyponatremia, possibly from congestive heart failure. We will need to monitor closely while the patient is on Lasix.   CODE STATUS: The patient is a DO NOT RESUSCITATE status.   TIME SPENT: Approximately 50 minutes.    ____________________________ Donell Beers. Benjie Karvonen, MD spm:ja D: 02/03/2014  18:00:38 ET T: 02/03/2014 21:46:03 ET JOB#: [id number]  cc: Torien Ramroop P. Benjie Karvonen, MD, <Dictator> Irven Easterly. Kary Kos, MD Corey Skains, MD Donell Beers Josephine Wooldridge MD ELECTRONICALLY SIGNED 02/08/2014 18:14

## 2015-01-18 NOTE — Consult Note (Signed)
   Present Illness 81 yo with ef of 40-45% with mr and tr by echo in the recent past admitted iwth progressive shortness of breath, afib with rvr and cxr suggesting persistant pulmonary edema. SHe has improved with calcium channel blockers, diuresis and bronchodilators. Telemetry shows afib with variable vr. Has ruled out for mi.   Physical Exam:  GEN no acute distress   HEENT PERRL, hearing intact to voice   NECK supple   RESP no use of accessory muscles  rhonchi   CARD Irregular rate and rhythm  Tachycardic   ABD no hernia   LYMPH negative neck   EXTR negative cyanosis/clubbing, negative edema   SKIN normal to palpation   NEURO cranial nerves intact, motor/sensory function intact   PSYCH A+O to time, place, person   Review of Systems:  Subjective/Chief Complaint sob   General: Weakness   Skin: No Complaints   ENT: No Complaints   Eyes: No Complaints   Neck: No Complaints   Respiratory: Short of breath   Cardiovascular: Tightness  Palpitations   Gastrointestinal: No Complaints   Genitourinary: No Complaints   Vascular: No Complaints   Musculoskeletal: No Complaints   Neurologic: No Complaints   Hematologic: No Complaints   Endocrine: No Complaints   Psychiatric: No Complaints   Review of Systems: All other systems were reviewed and found to be negative   Medications/Allergies Reviewed Medications/Allergies reviewed   EKG:  Abnormal NSSTTW changes   Interpretation afib witih rvr    Statins: Swelling  Ace Inhibitors: Swelling  Norvasc: Other   Impression 81 yo with ef of 40-45% with mr and tr by echo in the recent past admitted iwth progressive shortness of breath, afib with rvr and cxr suggesting persistant pulmonary edema. SHe has improved with calcium channel blockers, diuresis and bronchodilators. Telemetry shows afib with variable vr. Has ruled out for mi. WIll conitnue to attempt rate control with cardizem cd at 120 mg dialy and  metoprolol at 100 tartrate bid. Agree with steroids for reactive airway disease. Will follow rate. Consider increasing cardizem cd to 180 mg daily if rate increases.   Plan 1. Continue cardizem cd 120 mg and metoprolol 100 tartrate bid 2. Agressive treatment of ractive airway disease 3. Follow rate 4. Further recs pending course   Electronic Signatures: Dalia HeadingFath, Blakelee Allington A (MD)  (Signed 01-Jun-15 08:27)  Authored: General Aspect/Present Illness, History and Physical Exam, Review of System, EKG , Allergies, Impression/Plan   Last Updated: 01-Jun-15 08:27 by Dalia HeadingFath, Kutler Vanvranken A (MD)

## 2015-01-18 NOTE — Consult Note (Signed)
Brief Consult Note: Diagnosis: 81 yo with history of chf (probable diastollic with ef of 40-45%), copd and afib with rvr who was admitted with increased sob and rapid heart rate.   Patient was seen by consultant.   Recommend further assessment or treatment.   Comments: 81 yo with ef of 40-45% with mr and tr by echo in the recent past admitted iwth progressive shortness of breath, afib with rvr and cxr suggesting persistant pulmonary edema. SHe has improved with calcium channel blockers, diuresis and bronchodilators. Telemetry shows afib with variable vr. Has ruled out for mi. WIll conitnue to attempt rate control with cardizem cd at 120 mg dialy and metoprolol at 100 tartrate bid. Agree with steroids for reacitve airway disease. Will follow rate. Consider increaseing cardizem cd to 180 mg daily if rate increases.  Electronic Signatures: Dalia HeadingFath, Aaren Atallah A (MD)  (Signed 23-May-15 08:09)  Authored: Brief Consult Note   Last Updated: 23-May-15 08:09 by Dalia HeadingFath, Dorn Hartshorne A (MD)

## 2015-10-08 ENCOUNTER — Ambulatory Visit (INDEPENDENT_AMBULATORY_CARE_PROVIDER_SITE_OTHER): Payer: Medicare Other | Admitting: Podiatry

## 2015-10-08 ENCOUNTER — Ambulatory Visit (INDEPENDENT_AMBULATORY_CARE_PROVIDER_SITE_OTHER): Payer: Medicare Other

## 2015-10-08 ENCOUNTER — Encounter: Payer: Self-pay | Admitting: Podiatry

## 2015-10-08 VITALS — BP 113/73 | HR 76 | Resp 16

## 2015-10-08 DIAGNOSIS — M2011 Hallux valgus (acquired), right foot: Secondary | ICD-10-CM

## 2015-10-08 DIAGNOSIS — M7751 Other enthesopathy of right foot: Secondary | ICD-10-CM

## 2015-10-08 DIAGNOSIS — M71571 Other bursitis, not elsewhere classified, right ankle and foot: Secondary | ICD-10-CM

## 2015-10-08 DIAGNOSIS — M778 Other enthesopathies, not elsewhere classified: Secondary | ICD-10-CM

## 2015-10-08 DIAGNOSIS — M779 Enthesopathy, unspecified: Secondary | ICD-10-CM

## 2015-10-08 NOTE — Progress Notes (Signed)
   Subjective:    Patient ID: April Barrett, female    DOB: 1927-11-27, 82 y.o.   MRN: [id number]  HPI: She presents today with a chief complaint of a painful lesion plantar medial aspect of the first metatarsophalangeal joint of the right foot. She states that this callused area really hurts particularly with shoe gear. She states this been present for proximal 6 months denies any trauma or treatment at home.  Review of Systems  HENT: Positive for hearing loss and sneezing.   Eyes: Positive for itching.  Respiratory: Positive for shortness of breath.   Cardiovascular: Positive for leg swelling.  Gastrointestinal: Positive for constipation and abdominal distention.  Endocrine: Positive for polyuria.  Genitourinary: Positive for frequency.  Musculoskeletal: Positive for back pain, arthralgias and gait problem.  Skin:       Change in nails  Neurological: Positive for numbness.  Hematological: Bruises/bleeds easily.  All other systems reviewed and are negative.      Objective:   Physical Exam: 82 year old female vital signs stable alert and oriented 3 in no apparent distress. Pulses are intact bilateral. Neurologic sensorium is intact percent was C monofilament. Deep tendon reflexes intact bilateral muscle strength +5 over 5 dorsiflexion plantar flexion as inverters and everters all intrinsic musculature is intact. Orthopedic evaluation and x-rays all joints distal to the ankle range of motion without crepitation. Cutaneous evaluation demonstrates hyperkeratosis medial aspect first metatarsophalangeal joint right foot without skin breakdown. There does appear to be a bursitis or capsulitis beneath this lesion. Radiographs confirm hallux valgus deformity.        Assessment & Plan:  Assessment: Capsulitis bursitis porokeratotic lesion medial aspect first metatarsophalangeal joint right foot.  Plan: I injected first metatarsophalangeal joint after sterile Betadine skin prep today with  dexamethasone and local anesthetic also debrided reactive hyperkeratotic lesion discussed her sugars to change size and ice therapy follow up with me should this not resolve in the next few days.

## 2015-11-10 DIAGNOSIS — I350 Nonrheumatic aortic (valve) stenosis: Secondary | ICD-10-CM | POA: Insufficient documentation

## 2016-01-20 DIAGNOSIS — M419 Scoliosis, unspecified: Secondary | ICD-10-CM | POA: Insufficient documentation

## 2016-01-20 DIAGNOSIS — M25552 Pain in left hip: Secondary | ICD-10-CM | POA: Insufficient documentation

## 2018-10-23 DIAGNOSIS — R29898 Other symptoms and signs involving the musculoskeletal system: Secondary | ICD-10-CM | POA: Insufficient documentation

## 2018-10-23 DIAGNOSIS — R200 Anesthesia of skin: Secondary | ICD-10-CM | POA: Insufficient documentation

## 2018-11-08 ENCOUNTER — Ambulatory Visit (INDEPENDENT_AMBULATORY_CARE_PROVIDER_SITE_OTHER): Payer: Medicare Other | Admitting: Podiatry

## 2018-11-08 ENCOUNTER — Encounter: Payer: Self-pay | Admitting: Podiatry

## 2018-11-08 DIAGNOSIS — Q828 Other specified congenital malformations of skin: Secondary | ICD-10-CM | POA: Diagnosis not present

## 2018-11-08 NOTE — Progress Notes (Signed)
She presents today after having not seen her in about 3 years with a chief complaint of painful calluses to the medial aspect of the first metatarsal phalangeal joint plantar medial arch of the left foot.  She denies fever chills nausea vomiting muscle aches pains but these become painful and is hard to walk.  Objective: Vital signs are stable she is alert oriented x3 pulses are palpable.  Went ahead and performed debridement of all these reactive hyperkeratotic tissue today.  No open lesions or wounds are noted.  Assessment: Porokeratosis medial aspect of the medial longitudinal arch plantar aspect left foot just beneath the first metatarsal base.  She also has a reactive hyperkeratotic lesion medial aspect of the head of the first metatarsal of the right foot neither of these are open.  Plan: Debrided all reactive hyperkeratosis.

## 2019-05-21 ENCOUNTER — Other Ambulatory Visit: Payer: Self-pay

## 2019-05-21 ENCOUNTER — Emergency Department: Payer: Medicare Other

## 2019-05-21 ENCOUNTER — Emergency Department
Admission: EM | Admit: 2019-05-21 | Discharge: 2019-05-22 | Disposition: A | Discharge status: Skilled Nursing Facility | Payer: Medicare Other | Attending: Emergency Medicine | Admitting: Emergency Medicine

## 2019-05-21 DIAGNOSIS — S0990XA Unspecified injury of head, initial encounter: Secondary | ICD-10-CM | POA: Diagnosis present

## 2019-05-21 DIAGNOSIS — Y939 Activity, unspecified: Secondary | ICD-10-CM | POA: Diagnosis not present

## 2019-05-21 DIAGNOSIS — Y9289 Other specified places as the place of occurrence of the external cause: Secondary | ICD-10-CM | POA: Diagnosis not present

## 2019-05-21 DIAGNOSIS — M4844XA Fatigue fracture of vertebra, thoracic region, initial encounter for fracture: Secondary | ICD-10-CM | POA: Diagnosis not present

## 2019-05-21 DIAGNOSIS — M545 Low back pain, unspecified: Secondary | ICD-10-CM

## 2019-05-21 DIAGNOSIS — W010XXA Fall on same level from slipping, tripping and stumbling without subsequent striking against object, initial encounter: Secondary | ICD-10-CM | POA: Diagnosis not present

## 2019-05-21 DIAGNOSIS — Y999 Unspecified external cause status: Secondary | ICD-10-CM | POA: Diagnosis not present

## 2019-05-21 DIAGNOSIS — S0003XA Contusion of scalp, initial encounter: Secondary | ICD-10-CM | POA: Diagnosis not present

## 2019-05-21 DIAGNOSIS — Z7982 Long term (current) use of aspirin: Secondary | ICD-10-CM | POA: Insufficient documentation

## 2019-05-21 DIAGNOSIS — Z20828 Contact with and (suspected) exposure to other viral communicable diseases: Secondary | ICD-10-CM | POA: Diagnosis not present

## 2019-05-21 DIAGNOSIS — I10 Essential (primary) hypertension: Secondary | ICD-10-CM | POA: Diagnosis not present

## 2019-05-21 DIAGNOSIS — W19XXXA Unspecified fall, initial encounter: Secondary | ICD-10-CM

## 2019-05-21 MED ORDER — CALCIUM CARBONATE ANTACID 500 MG PO CHEW
400.0000 mg | CHEWABLE_TABLET | Freq: Once | ORAL | Status: AC
  Administered 2019-05-22: 400 mg via ORAL
  Filled 2019-05-21: qty 2

## 2019-05-21 MED ORDER — ACETAMINOPHEN 500 MG PO TABS
1000.0000 mg | ORAL_TABLET | Freq: Once | ORAL | Status: AC
  Administered 2019-05-21: 16:00:00 1000 mg via ORAL
  Filled 2019-05-21: qty 2

## 2019-05-21 MED ORDER — LIDOCAINE 5 % EX PTCH
1.0000 | MEDICATED_PATCH | CUTANEOUS | Status: DC
  Administered 2019-05-21: 16:00:00 1 via TRANSDERMAL
  Filled 2019-05-21: qty 1

## 2019-05-21 MED ORDER — ACETAMINOPHEN 500 MG PO TABS
1000.0000 mg | ORAL_TABLET | Freq: Once | ORAL | Status: AC
  Administered 2019-05-21: 20:00:00 1000 mg via ORAL
  Filled 2019-05-21: qty 2

## 2019-05-21 MED ORDER — LIDOCAINE 5 % EX PTCH
1.0000 | MEDICATED_PATCH | CUTANEOUS | Status: DC
  Administered 2019-05-21: 20:00:00 1 via TRANSDERMAL
  Filled 2019-05-21: qty 1

## 2019-05-21 NOTE — ED Notes (Signed)
Pt unable to be fitted for TLSO brace due to spinal curvature, per [employer]. MD Siadecki aware.

## 2019-05-21 NOTE — ED Notes (Signed)
This RN spoke with Mariane Baumgarten, RN at [facility] to discuss pt moving to assisted living facility. Paulina, RN informed this RN that she would have to contact admissions to determine if this pt would be able to move to the assisted living. Paulina, RN said they would call this RN back when they heard anything.

## 2019-05-21 NOTE — ED Provider Notes (Signed)
[facility] Emergency Department Provider Note   ____________________________________________   First MD Initiated Contact with Patient 05/21/19 1403     (approximate)  I have reviewed the triage vital signs and the nursing notes.   HISTORY  Chief Complaint Fall    HPI April Barrett is a 85 y.o. female 85 year old female with past medical history of scoliosis, A. fib, pulmonary hypertension presents to the ED complaining of fall.  Patient reports that just prior to arrival she was walking down a slope carrying bags of clothes when she slipped and fell backwards.  She struck the back of her head, but denies losing consciousness.  She does not take any blood thinners, denies any vision changes, numbness, weakness.  She also complains of pain from her mid to low back, states whenever she has a fall she will have significant pain in her back due to scoliosis.  She denies any pain in her extremities, also denies chest pain or abdominal pain.        History reviewed. No pertinent past medical history.  Patient Active Problem List   Diagnosis Date Noted  . Numbness 10/23/2018  . Weakness of both legs 10/23/2018  . Acute pain of left hip 01/20/2016  . Scoliosis 01/20/2016  . Mild aortic valve stenosis 11/10/2015  . Osteoporosis, post-menopausal 01/14/2015  . Cardiomyopathy ([place]) 10/16/2014  . Chronic atrial fibrillation 10/16/2014  . Pulmonary hypertension ([place]) 10/16/2014  . Hypertensive left ventricular hypertrophy 10/16/2014  . MI (mitral incompetence) 10/16/2014  . TI (tricuspid incompetence) 10/16/2014  . Arteriosclerosis of coronary artery 02/20/2014  . Essential (primary) hypertension 01/31/2014  . Combined fat and carbohydrate induced hyperlipemia 01/31/2014  . Adult hypothyroidism 01/31/2014    History reviewed. No pertinent surgical history.  Prior to Admission medications   Medication Sig Start Date End Date Taking? Authorizing  Provider  Acetaminophen (TYLENOL ARTHRITIS PAIN PO) Take by mouth.    [provider]  aspirin EC 81 MG tablet Take by mouth.    [provider]  calcium carbonate (TUMS - DOSED IN MG ELEMENTAL CALCIUM) 500 MG chewable tablet Chew by mouth.    [provider]  Cholecalciferol (VITAMIN D-1000 MAX ST) 1000 units tablet Take by mouth.    [provider]  Cyanocobalamin (RA VITAMIN B-12 TR) 1000 MCG TBCR Take by mouth.    [provider]  diltiazem (CARDIZEM) 30 MG tablet 1 TAB EVERY 6 HOURS AS NEEDED FOR HEART RATE MORE THAN 100 THEN HOLD FOR SBP LESS THAN 100 04/01/14   [provider]  diltiazem (TIAZAC) 180 MG 24 hr capsule  11/07/18   [provider]  docusate sodium (STOOL SOFTENER) 100 MG capsule Take by mouth.    [provider]  esomeprazole (NEXIUM) 40 MG capsule Take by mouth.    [provider]  furosemide (LASIX) 40 MG tablet Take by mouth. 02/20/15   [provider]  levothyroxine (SYNTHROID, LEVOTHROID) 100 MCG tablet TAKE 1 TABLET BY MOUTH DAILY ON EMPTY STOMACH WITH GLASS OF WATER 30-60 MINUTES BEFORE BREAKFAST 07/28/15   [provider]  metoprolol succinate (TOPROL-XL) 100 MG 24 hr tablet TAKE 1 TABLET (100 MG TOTAL) BY MOUTH 2 (TWO) TIMES DAILY. 08/14/15   [provider]  polyethylene glycol powder (GLYCOLAX/MIRALAX) powder take 17 grams by mouth once daily ; [place] in 4 to 8 ounces OF FLUID PRIOR TO TAKING 09/06/15   [provider]  potassium chloride (KLOR-CON 10) 10 MEQ tablet TAKE 2  TABLETS DAILY 02/18/15   [provider]  potassium chloride SA (K-DUR,KLOR-CON) 20 MEQ tablet TK 1 T PO BID 07/25/18   [provider]  zoledronic acid (RECLAST) 5 MG/100ML SOLN injection Inject into the vein.    [provider]    Allergies Ace inhibitors, Amlodipine, and Statins  No family history on file.  Social History Social History   Tobacco Use   . Smoking status: Unknown If Ever Smoked  . Smokeless tobacco: Never Used  Substance Use Topics  . Alcohol use: No    Alcohol/week: 0.0 standard drinks  . Drug use: Not on file    Review of Systems  Constitutional: No fever/chills Eyes: No visual changes. ENT: No sore throat. Cardiovascular: Denies chest pain. Respiratory: Denies shortness of breath. Gastrointestinal: No abdominal pain.  No nausea, no vomiting.  No diarrhea.  No constipation. Genitourinary: Negative for dysuria. Musculoskeletal: Positive for back pain. Skin: Negative for rash. Neurological: Negative for headaches, focal weakness or numbness.  ____________________________________________   PHYSICAL EXAM:  VITAL SIGNS: ED Triage Vitals  Enc Vitals Group     BP      Pulse      Resp      Temp      Temp src      SpO2      Weight      Height      Head Circumference      Peak Flow      Pain Score      Pain Loc      Pain Edu?      Excl. in GC?     Constitutional: Alert and oriented. Eyes: Conjunctivae are normal.  Pupils equal round and reactive to light, extraocular movements intact. Head: Small hematoma to occiput, no laceration or bleeding. Nose: No congestion/rhinnorhea. Mouth/Throat: Mucous membranes are moist. Neck: Normal ROM Cardiovascular: Normal rate, regular rhythm. Grossly normal heart sounds. Respiratory: Normal respiratory effort.  No retractions. Lungs CTAB. Gastrointestinal: Soft and nontender. No distention. Genitourinary: deferred Musculoskeletal: No lower extremity tenderness nor edema.  Significant scoliosis with tenderness along midline thoracic and lumbar spine. Neurologic:  Normal speech and language. No gross focal neurologic deficits are appreciated.  5-5 strength in bilateral upper and lower extremities, no pronator drift. Skin:  Skin is warm, dry and intact. No rash noted. Psychiatric: Mood and affect are normal. Speech and behavior are normal.   ____________________________________________   LABS (all labs ordered are listed, but only abnormal results are displayed)  Labs Reviewed - No data to display   PROCEDURES  Procedure(s) performed (including Critical Care):  Procedures   ____________________________________________   INITIAL IMPRESSION / ASSESSMENT AND PLAN / ED COURSE       85 year old female with history of scoliosis presents following mechanical fall onto her head and back.  Will obtain CT head and C-spine, low overall suspicion for acute intracranial injury, patient is not anticoagulated and has benign neurologic exam.  No other apparent traumatic injury outside of patient's midline spinal tenderness.  Will screen x-rays of spine.  She does have small abrasion to left elbow but no associated bony tenderness there or anywhere else in her extremities.  CT head and C-spine negative for acute process, plain films of thoracic and lumbar spine unremarkable, but limited evaluation given patient's level of scoliosis and limitations of plain films.  Patient continues to have significant back discomfort, will further assess with CT.  Patient turned over to Dr. Marisa SeverinSiadecki pending CT results.  ____________________________________________   FINAL CLINICAL IMPRESSION(S) / ED DIAGNOSES  Final diagnoses:  Fall, initial encounter  Hematoma of scalp, initial encounter  Acute midline low back pain without sciatica     ED Discharge Orders    None       Note:  This document was prepared using Dragon voice recognition software and may include unintentional dictation errors.   Chesley NoonJessup, Husain Costabile, MD 05/21/19 1525

## 2019-05-21 NOTE — ED Notes (Signed)
Pt daughter, Theone Stanley ([phone]), updated on pt status.

## 2019-05-21 NOTE — ED Triage Notes (Signed)
To ER from [facility] after mechanical fall. VSS. Pt c/o back pain, abrasion to left elbow. Pt did strike head on fall but does not use blood thinner and denies LOC. Pt alert and oriented X4, cooperative, RR even and unlabored, color WNL. Pt in NAD.

## 2019-05-21 NOTE — ED Notes (Signed)
Pt still resting comfortably at this time. Hold PO TUMS per MD Siadecki until pt awakes.

## 2019-05-21 NOTE — ED Provider Notes (Signed)
-----------------------------------------   10:07 PM on 05/21/2019 -----------------------------------------  I took over care of this patient from Dr. Charna Archer.  The patient was pending CT thoracic and lumbar spine to evaluate for acute fracture.  The CTs reveal no acute lumbar fracture, but the patient has endplate fractures at T5 and T6.    On reassessment she has no neurologic deficits and is able to ambulate although does have relatively significant pain and is somewhat debilitated from her baseline.  I consulted Dr. Lacinda Axon from neurosurgery who evaluated the images.  He recommended that the patient could get a TLSO brace if she is able to tolerate it, although it was not mandatory.  He recommends pain control and follow-up in 3 to 4 weeks.  There is no indication for surgical intervention.  Because the patient is in an independent living situation, she is currently not functional enough to go home.  The RN contacted [facility] to determine if she could go to the assisted living part of the facility for further care, however in order to do this they require an [place] 2 form which will need to be completed by social work.  The Ortho technician came to place the TLSO brace, however it was not able to be fitted correctly given the patient's severe scoliosis.  Although as discussed with Dr. Lacinda Axon, the TLSO brace is not mandatory.  The patient will board in the ED overnight, pending social work evaluation in the morning.  Plan will be to discharge to her facility once the appropriate paperwork can be completed.    Arta Silence, MD 05/21/19 2211

## 2019-05-21 NOTE — ED Notes (Signed)
Seth Bake, admissions coordinator at [facility] ([phone]), informed this RN that [place] form would have to be filled out to move pt to healthcare side of facility. MD Siadecki aware.

## 2019-05-21 NOTE — Discharge Instructions (Addendum)
Follow-up with Dr. Lacinda Axon fo Neurosurgery in 3-4 weeks  Wear the brace when upright for pain control. Take tylenol 650 mg every 4 hours as needed for pain.   NO heavy lifting or bending

## 2019-05-21 NOTE — ED Notes (Signed)
Pt resting comfortably at this time.

## 2019-05-21 NOTE — ED Notes (Signed)
[employer]  RN

## 2019-05-21 NOTE — ED Notes (Signed)
Pt given meal tray and water to drink. MD aware.  

## 2019-05-22 DIAGNOSIS — S0003XA Contusion of scalp, initial encounter: Secondary | ICD-10-CM | POA: Diagnosis not present

## 2019-05-22 LAB — SARS CORONAVIRUS 2 BY RT PCR (HOSPITAL ORDER, PERFORMED IN ~~LOC~~ HOSPITAL LAB): SARS Coronavirus 2: NEGATIVE

## 2019-05-22 MED ORDER — DILTIAZEM HCL ER COATED BEADS 180 MG PO CP24
180.0000 mg | ORAL_CAPSULE | Freq: Every day | ORAL | Status: DC
  Administered 2019-05-22: 13:00:00 180 mg via ORAL
  Filled 2019-05-22: qty 1

## 2019-05-22 MED ORDER — FUROSEMIDE 40 MG PO TABS
40.0000 mg | ORAL_TABLET | Freq: Two times a day (BID) | ORAL | Status: DC
  Administered 2019-05-22: 09:00:00 40 mg via ORAL
  Filled 2019-05-22: qty 1

## 2019-05-22 MED ORDER — POTASSIUM CHLORIDE CRYS ER 10 MEQ PO TBCR
10.0000 meq | EXTENDED_RELEASE_TABLET | Freq: Two times a day (BID) | ORAL | Status: DC
  Administered 2019-05-22: 13:00:00 10 meq via ORAL
  Filled 2019-05-22 (×2): qty 1

## 2019-05-22 MED ORDER — ACETAMINOPHEN 500 MG PO TABS: 500.0000 mg | ORAL_TABLET | Freq: Three times a day (TID) | ORAL | Status: DC | PRN

## 2019-05-22 MED ORDER — BISACODYL 5 MG PO TBEC: 5.0000 mg | DELAYED_RELEASE_TABLET | Freq: Every day | ORAL | Status: DC | PRN

## 2019-05-22 MED ORDER — ASPIRIN EC 81 MG PO TBEC
81.0000 mg | DELAYED_RELEASE_TABLET | Freq: Every day | ORAL | Status: DC
  Administered 2019-05-22: 09:00:00 81 mg via ORAL
  Filled 2019-05-22: qty 1

## 2019-05-22 MED ORDER — ONDANSETRON 4 MG PO TBDP
ORAL_TABLET | ORAL | Status: AC
  Filled 2019-05-22: qty 1

## 2019-05-22 MED ORDER — LEVOTHYROXINE SODIUM 50 MCG PO TABS
100.0000 ug | ORAL_TABLET | Freq: Every day | ORAL | Status: DC
  Administered 2019-05-22: 09:00:00 100 ug via ORAL
  Filled 2019-05-22: qty 2

## 2019-05-22 MED ORDER — METOPROLOL SUCCINATE ER 50 MG PO TB24
100.0000 mg | ORAL_TABLET | Freq: Two times a day (BID) | ORAL | Status: DC
  Administered 2019-05-22: 09:00:00 100 mg via ORAL
  Filled 2019-05-22: qty 2

## 2019-05-22 MED ORDER — VITAMIN B-12 1000 MCG PO TABS
1000.0000 ug | ORAL_TABLET | Freq: Every day | ORAL | Status: DC
  Administered 2019-05-22: 13:00:00 1000 ug via ORAL
  Filled 2019-05-22: qty 10
  Filled 2019-05-22: qty 1

## 2019-05-22 MED ORDER — TRAMADOL HCL 50 MG PO TABS
50.0000 mg | ORAL_TABLET | Freq: Four times a day (QID) | ORAL | Status: DC | PRN
  Administered 2019-05-22: 06:00:00 50 mg via ORAL
  Filled 2019-05-22: qty 1

## 2019-05-22 MED ORDER — CALCIUM CARBONATE ANTACID 500 MG PO CHEW
2.0000 | CHEWABLE_TABLET | Freq: Two times a day (BID) | ORAL | Status: DC | PRN
  Administered 2019-05-22: 13:00:00 400 mg via ORAL
  Filled 2019-05-22: qty 2

## 2019-05-22 MED ORDER — DILTIAZEM HCL ER BEADS 180 MG PO CP24
180.0000 mg | ORAL_CAPSULE | Freq: Every day | ORAL | Status: DC
  Filled 2019-05-22 (×2): qty 1

## 2019-05-22 MED ORDER — ACETAMINOPHEN 325 MG PO TABS
650.0000 mg | ORAL_TABLET | ORAL | Status: DC | PRN
  Administered 2019-05-22: 02:00:00 650 mg via ORAL
  Filled 2019-05-22: qty 2

## 2019-05-22 MED ORDER — VITAMIN D 25 MCG (1000 UNIT) PO TABS
1000.0000 [IU] | ORAL_TABLET | Freq: Every day | ORAL | Status: DC
  Administered 2019-05-22: 1000 [IU] via ORAL
  Filled 2019-05-22 (×2): qty 1

## 2019-05-22 MED ORDER — ONDANSETRON 4 MG PO TBDP: 4.0000 mg | ORAL_TABLET | Freq: Once | ORAL | Status: DC

## 2019-05-22 NOTE — ED Notes (Signed)
Purewick placed on pt. Pt agrees to take pain medication now. Pt adjusted in bed for comfort. Pt states she is still not comfortable. Will attempt to find a recliner.

## 2019-05-22 NOTE — ED Notes (Signed)
Patient is resting comfortably. 

## 2019-05-22 NOTE — [place] FL2 (Signed)
[place] MEDICAID FL2 LEVEL OF CARE SCREENING TOOL     IDENTIFICATION  Patient Name: April Barrett Birthdate: July 18, 1928 Sex: female Admission Date (Current Location): 05/21/2019  [place]   Facility and Address:  [facility], [address], [place], [place] [address]      Provider Number: [id number]  Attending Physician Name and Address:  No att. providers found  Relative Name and Phone Number:  [name],[name]     Daughter       [phone]    Current Level of Care: Hospital Recommended Level of Care: Skilled Nursing Facility Prior Approval Number:    Date Approved/Denied:   PASRR Number: [id number]  Discharge Plan: SNF    Current Diagnoses: Patient Active Problem List   Diagnosis Date Noted  . Numbness 10/23/2018  . Weakness of both legs 10/23/2018  . Acute pain of left hip 01/20/2016  . Scoliosis 01/20/2016  . Mild aortic valve stenosis 11/10/2015  . Osteoporosis, post-menopausal 01/14/2015  . Cardiomyopathy (HCC) 10/16/2014  . Chronic atrial fibrillation 10/16/2014  . Pulmonary hypertension (HCC) 10/16/2014  . Hypertensive left ventricular hypertrophy 10/16/2014  . MI (mitral incompetence) 10/16/2014  . TI (tricuspid incompetence) 10/16/2014  . Arteriosclerosis of coronary artery 02/20/2014  . Essential (primary) hypertension 01/31/2014  . Combined fat and carbohydrate induced hyperlipemia 01/31/2014  . Adult hypothyroidism 01/31/2014    Orientation RESPIRATION BLADDER Height & Weight     Self, Time, Situation, Place  Normal Continent Weight: 110 lb (49.9 kg) Height:  4\' 10"  (147.3 cm)  BEHAVIORAL SYMPTOMS/MOOD NEUROLOGICAL BOWEL NUTRITION STATUS      Continent Diet  AMBULATORY STATUS COMMUNICATION OF NEEDS Skin   Extensive Assist Verbally Normal                       Personal Care Assistance Level of Assistance  Feeding, Bathing, Dressing Bathing Assistance: Maximum  assistance Feeding assistance: Limited assistance Dressing Assistance: Maximum assistance     Functional Limitations Info  Sight, Hearing, Speech Sight Info: Adequate Hearing Info: Adequate Speech Info: Adequate    SPECIAL CARE FACTORS FREQUENCY  PT (By licensed PT), OT (By licensed OT)     PT Frequency: 5x a week OT Frequency: 5x a week            Contractures Contractures Info: Not present    Additional Factors Info  Allergies   Allergies Info: Ace Inhibitors, Amlodipine, Statins           Current Medications (05/22/2019):  This is the current hospital active medication list Current Facility-Administered Medications  Medication Dose Route Frequency Provider Last Rate Last Dose  . acetaminophen (TYLENOL) tablet 500 mg  500 mg Oral Q8H PRN Minna AntisPaduchowski, Kevin, MD      . acetaminophen (TYLENOL) tablet 650 mg  650 mg Oral Q4H PRN Irean HongSung, Jade J, MD   650 mg at 05/22/19 0211  . aspirin EC tablet 81 mg  81 mg Oral Daily Minna AntisPaduchowski, Kevin, MD   81 mg at 05/22/19 0841  . bisacodyl (DULCOLAX) EC tablet 5 mg  5 mg Oral Daily PRN Minna AntisPaduchowski, Kevin, MD      . calcium carbonate (TUMS - dosed in mg elemental calcium) chewable tablet 400 mg of elemental calcium  2 tablet Oral BID PRN Minna AntisPaduchowski, Kevin, MD      . cholecalciferol (VITAMIN D3) tablet 1,000 Units  1,000 Units Oral Daily Minna AntisPaduchowski, Kevin, MD      . diltiazem [facility](TIAZAC) 24 hr  capsule 180 mg  180 mg Oral Daily Harvest Dark, MD      . furosemide (LASIX) tablet 40 mg  40 mg Oral BID Harvest Dark, MD   40 mg at 05/22/19 0841  . levothyroxine (SYNTHROID) tablet 100 mcg  100 mcg Oral Daily Harvest Dark, MD   100 mcg at 05/22/19 0841  . lidocaine (LIDODERM) 5 % 1 patch  1 patch Transdermal Q24H Blake Divine, MD   1 patch at 05/21/19 1534  . lidocaine (LIDODERM) 5 % 1 patch  1 patch Transdermal Q24H Arta Silence, MD   1 patch at 05/21/19 2017  . metoprolol succinate (TOPROL-XL) 24 hr tablet 100 mg  100  mg Oral BID Harvest Dark, MD   100 mg at 05/22/19 0842  . ondansetron (ZOFRAN-ODT) disintegrating tablet 4 mg  4 mg Oral Once Paulette Blanch, MD      . potassium chloride (K-DUR) CR tablet 10 mEq  10 mEq Oral BID Harvest Dark, MD      . traMADol Veatrice Bourbon) tablet 50 mg  50 mg Oral Q6H PRN Paulette Blanch, MD   50 mg at 05/22/19 3016  . vitamin B-12 (CYANOCOBALAMIN) tablet 1,000 mcg  1,000 mcg Oral Daily Harvest Dark, MD       Current Outpatient Medications  Medication Sig Dispense Refill  . aspirin EC 81 MG tablet Take 81 mg by mouth daily.     . bisacodyl (DULCOLAX) 5 MG EC tablet Take 5 mg by mouth daily as needed.     . calcium carbonate (TUMS - DOSED IN MG ELEMENTAL CALCIUM) 500 MG chewable tablet Chew 2 tablets by mouth 2 (two) times daily as needed.     . Cyanocobalamin (RA VITAMIN B-12 TR) 1000 MCG TBCR Take 1,000 mcg by mouth daily.     Marland Kitchen diltiazem (TIAZAC) 180 MG 24 hr capsule Take 180 mg by mouth daily.     . furosemide (LASIX) 40 MG tablet Take 40 mg by mouth 2 (two) times daily.     Marland Kitchen levothyroxine (SYNTHROID, LEVOTHROID) 100 MCG tablet Take 100 mcg by mouth daily.   1  . metoprolol succinate (TOPROL-XL) 100 MG 24 hr tablet Take 100 mg by mouth 2 (two) times daily.   3  . potassium chloride (KLOR-CON 10) 10 MEQ tablet Take 10 mEq by mouth 2 (two) times daily.     Marland Kitchen acetaminophen (TYLENOL) 500 MG tablet Take 500 mg by mouth every 8 (eight) hours as needed for mild pain or fever.    . Cholecalciferol (VITAMIN D-1000 MAX ST) 1000 units tablet Take 1,000 Units by mouth daily.        Discharge Medications: Please see discharge summary for a list of discharge medications.  Relevant Imaging Results:  Relevant Lab Results:   Additional Information SSN:  [id number]  Tania Yesli Vanderhoff, LCSW

## 2019-05-22 NOTE — ED Notes (Signed)
Per report pt unable to wear tlso. EDP asked what to do about pt wanting to sit up greater than 30 degrees and wanting to sit in chair. Per edp ok for pt to sit in chair and walk.

## 2019-05-22 NOTE — ED Notes (Signed)
[facility]  CALLED  FOR  TRANSPORT 

## 2019-05-22 NOTE — ED Provider Notes (Signed)
-----------------------------------------   5:47 AM on 05/22/2019 -----------------------------------------   Blood pressure (!) 150/80, pulse 80, temperature 98 F (36.7 C), temperature source Oral, resp. rate 17, height 4\' 10"  (1.473 m), weight 49.9 kg, SpO2 94 %.  Patient was nauseated secondary to pain but declined anything other than Tylenol.  Otherwise she is resting.  Awaiting social work consult for [facility] to return to [facility] higher level of care section.   Paulette Blanch, MD 05/22/19 [phone]

## 2019-05-22 NOTE — Social Work (Addendum)
CSW working on [employer] so patient can return to [facility] SNF    8:46am : Lawerance Sabal at [facility] stated that in addition to the [facility], she will need a COVID test, PT evaluation, and AVS.  CSW will notify EDP.   11:06am : CSW sent FL2 and COVID test results to [facility]. Waiting for the PT evaluation.   3:33pm-  CSW sent to PT eval and AVS to [facility]. Patient ready for discharge.    Room: 326  Number to report : [phone]   EDP notified  ED Secretary notified   Fredric Mare  [facility] ED  [phone]

## 2019-05-22 NOTE — Evaluation (Signed)
Physical Therapy Evaluation Patient Details Name: April Barrett MRN: [id number] DOB: 09/03/1928 Today's Date: 05/22/2019   History of Present Illness  Pt comes to ED secondary to low back pain s/p fall at [facility]. History includes scoliosis. Of note, + T5/T6 fractures, recommended TLSO by neurosx, however unable to be fitted secondary to curvature of her back. Cleared to work with therapy without bracing.  Clinical Impression  Pt is a pleasant 85 year old female who was admitted for T5/T6 spinal fracture s/p fall. Pt performs bed mobility, transfers, and ambulation with min assist and RW. Educated on back precautions and performed bed mobility with recommended technique. Pt reports decreased pain with therapist assist. Pt demonstrates deficits with strength/pain/endurance. Pt is unsteady during gait, needs hands on assist for balance and fall prevention. Is currently not at baseline level.  Would benefit from skilled PT to address above deficits and promote optimal return to PLOF; recommend transition to STR upon discharge from acute hospitalization.     Follow Up Recommendations SNF    Equipment Recommendations  None recommended by PT    Recommendations for Other Services       Precautions / Restrictions Precautions Precautions: Fall Restrictions Weight Bearing Restrictions: No      Mobility  Bed Mobility Overal bed mobility: Needs Assistance Bed Mobility: Supine to Sit     Supine to sit: Min assist     General bed mobility comments: initially pt twists out of bed and comes to EOB. Pt reports increased pain with movement. Educated on back precautions. Further performance located in ther-ex  Transfers Overall transfer level: Needs assistance Equipment used: Rolling walker (2 wheeled) Transfers: Sit to/from Stand Sit to Stand: Min assist         General transfer comment: able to stand at bedside and maintain balance while therapist assisted in donning pants/shoes. Needs  hands on assist to maintain balance with challenges. Slight post leaning noted, able to self correct with cues  Ambulation/Gait Ambulation/Gait assistance: Min assist Gait Distance (Feet): 20 Feet Assistive device: Rolling walker (2 wheeled) Gait Pattern/deviations: Step-through pattern     General Gait Details: choppy steps noted with assist needed for RW management. Tends to keep body too far away from RW and veers to either side. Decreased pain with movement. Unsteady  Stairs            Wheelchair Mobility    Modified Rankin (Stroke Patients Only)       Balance Overall balance assessment: History of Falls;Needs assistance Sitting-balance support: Feet unsupported;Bilateral upper extremity supported Sitting balance-Leahy Scale: Good     Standing balance support: Bilateral upper extremity supported Standing balance-Leahy Scale: Fair                               Pertinent Vitals/Pain Pain Assessment: Faces Faces Pain Scale: Hurts little more Pain Location: low back with twisting Pain Descriptors / Indicators: Aching Pain Intervention(s): Limited activity within patient's tolerance    Home Living Family/patient expects to be discharged to:: Private residence Living Arrangements: Alone   Type of Home: Independent living facility Home Access: Level entry     Home Layout: One level Home Equipment: Walker - 2 wheels      Prior Function Level of Independence: Independent         Comments: reports she was indep prior, able to perform all ADLs.      Hand Dominance  Extremity/Trunk Assessment   Upper Extremity Assessment Upper Extremity Assessment: Overall WFL for tasks assessed    Lower Extremity Assessment Lower Extremity Assessment: Generalized weakness(B LE grossly 4/5)       Communication   Communication: No difficulties  Cognition Arousal/Alertness: Awake/alert Behavior During Therapy: WFL for tasks  assessed/performed Overall Cognitive Status: Within Functional Limits for tasks assessed                                        General Comments      Exercises Other Exercises Other Exercises: Supine log rolling performed with cues for instruction and min assist for performance. Reports decreased pain with practice. Also performed ankle pumps x 10 reps on B LE with supervision.   Assessment/Plan    PT Assessment Patient needs continued PT services  PT Problem List Decreased strength;Decreased activity tolerance;Decreased balance;Decreased mobility;Decreased knowledge of use of DME;Decreased safety awareness;Pain;Decreased knowledge of precautions       PT Treatment Interventions Gait training;Therapeutic activities;Therapeutic exercise;Balance training;DME instruction    PT Goals (Current goals can be found in the Care Plan section)  Acute Rehab PT Goals Patient Stated Goal: to get back her independence PT Goal Formulation: With patient Time For Goal Achievement: 06/05/19 Potential to Achieve Goals: Good    Frequency Min 2X/week   Barriers to discharge Inaccessible home environment;Decreased caregiver support      Co-evaluation               AM-PAC PT "6 Clicks" Mobility  Outcome Measure Help needed turning from your back to your side while in a flat bed without using bedrails?: A Little Help needed moving from lying on your back to sitting on the side of a flat bed without using bedrails?: A Little Help needed moving to and from a bed to a chair (including a wheelchair)?: A Little Help needed standing up from a chair using your arms (e.g., wheelchair or bedside chair)?: A Little Help needed to walk in hospital room?: A Little Help needed climbing 3-5 steps with a railing? : A Lot 6 Click Score: 17    End of Session Equipment Utilized During Treatment: Gait belt Activity Tolerance: Patient tolerated treatment well Patient left: in bed Nurse  Communication: Mobility status PT Visit Diagnosis: Unsteadiness on feet (R26.81);Muscle weakness (generalized) (M62.81);History of falling (Z91.81);Difficulty in walking, not elsewhere classified (R26.2);Pain Pain - Right/Left: (back) Pain - part of body: (back)    Time: 5993-5701 PT Time Calculation (min) (ACUTE ONLY): 21 min   Charges:   PT Evaluation $PT Eval Low Complexity: 1 Low PT Treatments $Therapeutic Activity: 8-22 mins        Greggory Stallion, PT, DPT [phone]   Canisha Issac 05/22/2019, 3:11 PM

## 2019-05-22 NOTE — ED Notes (Signed)
Pt assisted to bathroom at this time. Pt requiring 1 person assisted. Peri-care performed independently. Pt assisted back to bed. This RN will continue to monitor.

## 2019-05-22 NOTE — ED Provider Notes (Signed)
-----------------------------------------   3:22 PM on 05/22/2019 -----------------------------------------  85 yo F here with thoracic EP fx. TLSO ordered and placed. Pt was awaiting placement into SNF portion of current facility and has now been accepted. Declines anything additional for pain. She has been HDS here and cleared for d/c by initial provider. No infectious sx. COVID neg.   Duffy Bruce, MD 05/22/19 [phone]

## 2019-05-24 DIAGNOSIS — S22050A Wedge compression fracture of T5-T6 vertebra, initial encounter for closed fracture: Secondary | ICD-10-CM

## 2019-05-24 DIAGNOSIS — I4891 Unspecified atrial fibrillation: Secondary | ICD-10-CM

## 2019-05-24 DIAGNOSIS — I429 Cardiomyopathy, unspecified: Secondary | ICD-10-CM

## 2019-05-24 DIAGNOSIS — K219 Gastro-esophageal reflux disease without esophagitis: Secondary | ICD-10-CM

## 2019-05-24 DIAGNOSIS — E441 Mild protein-calorie malnutrition: Secondary | ICD-10-CM

## 2019-08-03 ENCOUNTER — Encounter: Payer: Self-pay | Admitting: Internal Medicine

## 2019-08-03 DIAGNOSIS — I480 Paroxysmal atrial fibrillation: Secondary | ICD-10-CM | POA: Insufficient documentation

## 2019-08-03 DIAGNOSIS — E785 Hyperlipidemia, unspecified: Secondary | ICD-10-CM | POA: Insufficient documentation

## 2019-08-03 DIAGNOSIS — I5032 Chronic diastolic (congestive) heart failure: Secondary | ICD-10-CM | POA: Insufficient documentation

## 2019-08-03 DIAGNOSIS — I350 Nonrheumatic aortic (valve) stenosis: Secondary | ICD-10-CM | POA: Insufficient documentation

## 2019-08-03 DIAGNOSIS — I779 Disorder of arteries and arterioles, unspecified: Secondary | ICD-10-CM | POA: Insufficient documentation

## 2019-08-03 DIAGNOSIS — E039 Hypothyroidism, unspecified: Secondary | ICD-10-CM | POA: Insufficient documentation

## 2019-08-03 DIAGNOSIS — K219 Gastro-esophageal reflux disease without esophagitis: Secondary | ICD-10-CM | POA: Insufficient documentation

## 2019-08-09 ENCOUNTER — Encounter: Payer: Self-pay | Admitting: Internal Medicine

## 2019-08-09 ENCOUNTER — Ambulatory Visit: Payer: Medicare Other | Admitting: Internal Medicine

## 2019-08-09 ENCOUNTER — Other Ambulatory Visit: Payer: Self-pay

## 2019-08-09 VITALS — BP 119/69 | HR 60 | Temp 96.8°F | Resp 16 | Wt 102.6 lb

## 2019-08-09 DIAGNOSIS — I272 Pulmonary hypertension, unspecified: Secondary | ICD-10-CM

## 2019-08-09 DIAGNOSIS — I480 Paroxysmal atrial fibrillation: Secondary | ICD-10-CM

## 2019-08-09 DIAGNOSIS — F39 Unspecified mood [affective] disorder: Secondary | ICD-10-CM | POA: Insufficient documentation

## 2019-08-09 DIAGNOSIS — I6523 Occlusion and stenosis of bilateral carotid arteries: Secondary | ICD-10-CM

## 2019-08-09 DIAGNOSIS — I5032 Chronic diastolic (congestive) heart failure: Secondary | ICD-10-CM | POA: Diagnosis not present

## 2019-08-09 DIAGNOSIS — E441 Mild protein-calorie malnutrition: Secondary | ICD-10-CM | POA: Diagnosis not present

## 2019-08-09 DIAGNOSIS — M810 Age-related osteoporosis without current pathological fracture: Secondary | ICD-10-CM

## 2019-08-09 NOTE — Progress Notes (Signed)
Subjective:    Patient ID: April Barrett, female    DOB: [birth year], 85 y.o.   MRN: [id number]  HPI Visit in assisted living apartment to establish care Reviewed status with Luellen Pucker RN Reviewed [facility] clinic records and updated this chart  [facility] Widowed 2007 Was having more problems getting food and cooking Felt she was "eating junk and that was not the way to go"  Had fall and was at health care---I had seen her there Rib fractures, pneumothorax----this is improved Able to get in and out of bed without pain now PT now done--still getting some OT  She did hit her head hard---and having a hard time concentrating to read ?mild concussion  No chest pain Chronic DOE--if she overdoes. Gets dyspneic easy Gets a "heaviness" and feels her breathing is not right. "Like I need some fresh air" Sleeps flat--no PND Chronic edema---may be slightly worse Gets occasional palpitations--skips and occasional racing  Ha s some "terrible stress" with the move Getting rid of stuff was "so painful"  Reviewed past records Significant carotid stenosis even in 2008 No TIA or neuro symptoms though  Current Outpatient Medications on File Prior to Visit  Medication Sig Dispense Refill  . aspirin EC 81 MG tablet Take 81 mg by mouth daily.     . bisacodyl (DULCOLAX) 5 MG EC tablet Take 5 mg by mouth daily as needed.     . Cholecalciferol (VITAMIN D-1000 MAX ST) 1000 units tablet Take 1,000 Units by mouth daily.     . Cyanocobalamin (RA VITAMIN B-12 TR) 1000 MCG TBCR Take 1,000 mcg by mouth daily.     Marland Kitchen diltiazem (TIAZAC) 180 MG 24 hr capsule Take 180 mg by mouth daily.     . furosemide (LASIX) 40 MG tablet Take 40 mg by mouth 2 (two) times daily.     Marland Kitchen levothyroxine (SYNTHROID, LEVOTHROID) 100 MCG tablet Take 100 mcg by mouth daily.   1  . metoprolol succinate (TOPROL-XL) 100 MG 24 hr tablet Take 100 mg by mouth 2 (two) times daily.   3  . potassium chloride (KLOR-CON 10) 10  MEQ tablet Take 10 mEq by mouth 2 (two) times daily.     Marland Kitchen acetaminophen (TYLENOL) 500 MG tablet Take 500 mg by mouth every 8 (eight) hours as needed for mild pain or fever.    . calcium carbonate (TUMS - DOSED IN MG ELEMENTAL CALCIUM) 500 MG chewable tablet Chew 2 tablets by mouth 2 (two) times daily as needed.      No current facility-administered medications on file prior to visit.     Allergies  Allergen Reactions  . Ace Inhibitors Cough  . Amlodipine Swelling  . Statins Other (See Comments)    Past Medical History:  Diagnosis Date  . Aortic stenosis   . Carotid artery disease ([place])   . Chronic diastolic heart failure ([place])   . Coronary atherosclerosis of native coronary artery   . Essential hypertension, benign   . Gastroesophageal reflux disease   . History of kidney stones   . Hyperlipidemia   . Hypothyroidism   . Paroxysmal atrial fibrillation (HCC)   . Scoliosis     Past Surgical History:  Procedure Laterality Date  . CATARACT EXTRACTION    . Childbirth  [phone]  . TTA      Family History  Problem Relation Age of Onset  . Hypertension Mother   . AAA (abdominal aortic aneurysm) Mother   . Stroke Mother   .  Alzheimer's disease Father   . Prostate cancer Father   . Asthma Sister   . Hypertension Maternal Grandmother   . Colon cancer Maternal Grandmother   . Breast cancer Paternal Aunt     Social History   Socioeconomic History  . Marital status: Widowed    Spouse name: Not on file  . Number of children: Not on file  . Years of education: Not on file  . Highest education level: Not on file  Occupational History  . Not on file  Social Needs  . Financial resource strain: Not on file  . Food insecurity    Worry: Not on file    Inability: Not on file  . Transportation needs    Medical: Not on file    Non-medical: Not on file  Tobacco Use  . Smoking status: Unknown If Ever Smoked  . Smokeless tobacco: Never Used  Substance and Sexual  Activity  . Alcohol use: No    Alcohol/week: 0.0 standard drinks  . Drug use: Not on file  . Sexual activity: Not on file  Lifestyle  . Physical activity    Days per week: Not on file    Minutes per session: Not on file  . Stress: Not on file  Relationships  . Social Musician on phone: Not on file    Gets together: Not on file    Attends religious service: Not on file    Active member of club or organization: Not on file    Attends meetings of clubs or organizations: Not on file    Relationship status: Not on file  . Intimate partner violence    Fear of current or ex partner: Not on file    Emotionally abused: Not on file    Physically abused: Not on file    Forced sexual activity: Not on file  Other Topics Concern  . Not on file  Social History Narrative  . Not on file   Review of Systems Hearing is better Sleeping okay Bowels are "too vigorously".--has used biscodyl for year Voids okay    Objective:   Physical Exam  Constitutional: She appears well-developed. No distress.  Neck: No thyromegaly present.  Cardiovascular: Normal rate and regular rhythm. Exam reveals no gallop.  occ skip beats Soft systolic murmur at base  Respiratory: Effort normal and breath sounds normal. No respiratory distress. She has no wheezes. She has no rales.  GI: Soft. There is no abdominal tenderness.  Musculoskeletal:     Comments: 1+ non pitting edema  Lymphadenopathy:    She has no cervical adenopathy.  Psychiatric: She has a normal mood and affect. Her behavior is normal.           Assessment & Plan:

## 2019-08-09 NOTE — Assessment & Plan Note (Signed)
Will continue calcium and vitamin D

## 2019-08-09 NOTE — Assessment & Plan Note (Signed)
Chronic edema and dyspnea Seems stable on current regimen

## 2019-08-09 NOTE — Assessment & Plan Note (Signed)
Known stenosis Is on aspirin No statin due to age/appetite issues, etc

## 2019-08-09 NOTE — Assessment & Plan Note (Signed)
Chronic edema EF normal on last echo Doing okay with the furosemide

## 2019-08-09 NOTE — Assessment & Plan Note (Signed)
Stress with her move All seems reactive No meds needed

## 2019-08-09 NOTE — Assessment & Plan Note (Signed)
Regular now on metoprolol ASA only

## 2019-08-09 NOTE — Assessment & Plan Note (Signed)
Lost considerable weight living on her own with COVID Has gained weight since her stay in health care and eating better here

## 2019-08-29 ENCOUNTER — Encounter: Payer: Self-pay | Admitting: Internal Medicine

## 2019-08-29 ENCOUNTER — Other Ambulatory Visit: Payer: Self-pay

## 2019-08-29 ENCOUNTER — Ambulatory Visit: Payer: Medicare Other | Admitting: Internal Medicine

## 2019-08-29 DIAGNOSIS — S2242XA Multiple fractures of ribs, left side, initial encounter for closed fracture: Secondary | ICD-10-CM | POA: Diagnosis not present

## 2019-08-29 DIAGNOSIS — R0781 Pleurodynia: Secondary | ICD-10-CM

## 2019-08-29 NOTE — Patient Instructions (Signed)

## 2019-08-29 NOTE — Progress Notes (Signed)
April Barrett DOB 02-19-1928  HPI  Asked to see resident in [address]. C/o intermittent left side rib pain x 1 week. She describes the pain as sharp and stabbing. The pain does not radiate. It occurs about 4-5 times times per day. It seems to occur when she is sitting in certain positions. It is relieved by changing positions. She does have severe kyphosis. She has Tylenol PRN but has not taken it for this. She denies any injury to the area but reports she had a fall in July with rib fractures- but I can't find any evidence of this in Epic.  She has not noticed any rash in the area but reports one of the nurses or aides told her the area was discolored. She denies chest pain, chest tightness, shortness of breath, abdominal pain, urinary or bowel issues.   Review of Systems     Past Medical History:  Diagnosis Date  . Aortic stenosis   . Carotid artery disease (HCC)   . Chronic diastolic heart failure (HCC)   . Coronary atherosclerosis of native coronary artery   . Essential hypertension, benign   . Gastroesophageal reflux disease   . History of kidney stones   . Hyperlipidemia   . Hypothyroidism   . Paroxysmal atrial fibrillation (HCC)   . Scoliosis    Current Outpatient Medications on File Prior to Visit  Medication Sig Dispense Refill  . acetaminophen (TYLENOL) 500 MG tablet Take 500 mg by mouth every 8 (eight) hours as needed for mild pain or fever.    Marland Kitchen aspirin EC 81 MG tablet Take 81 mg by mouth daily.     . calcium carbonate (TUMS - DOSED IN MG ELEMENTAL CALCIUM) 500 MG chewable tablet Chew 2 tablets by mouth 2 (two) times daily as needed.     . Cholecalciferol (VITAMIN D-1000 MAX ST) 1000 units tablet Take 1,000 Units by mouth daily.     . Cyanocobalamin (RA VITAMIN B-12 TR) 1000 MCG TBCR Take 1,000 mcg by mouth daily.     Marland Kitchen diltiazem (TIAZAC) 180 MG 24 hr capsule Take 180 mg by mouth daily.     . furosemide (LASIX) 40 MG tablet Take 40 mg by mouth 2 (two) times daily.     Marland Kitchen  levothyroxine (SYNTHROID, LEVOTHROID) 100 MCG tablet Take 100 mcg by mouth daily.   1  . metoprolol succinate (TOPROL-XL) 100 MG 24 hr tablet Take 100 mg by mouth 2 (two) times daily.   3  . potassium chloride (KLOR-CON 10) 10 MEQ tablet Take 10 mEq by mouth 2 (two) times daily.     . sennosides-docusate sodium (SENOKOT-S) 8.6-50 MG tablet Take 2 tablets by mouth daily.     No current facility-administered medications on file prior to visit.     Allergies  Allergen Reactions  . Ace Inhibitors Cough  . Amlodipine Swelling  . Statins Other (See Comments)    Constitutional: Denies fever, malaise, fatigue, headache or abrupt weight changes.  Respiratory: Denies difficulty breathing, shortness of breath, cough or sputum production.   Cardiovascular: Denies chest pain, chest tightness, palpitations or swelling in the hands or feet.  Gastrointestinal: Denies abdominal pain, bloating, constipation, diarrhea or blood in the stool.  GU: Denies urgency, frequency, pain with urination, burning sensation, blood in urine, odor or discharge. Musculoskeletal: Pt reports left side rib pain. Denies decrease in range of motion, difficulty with gait, muscle pain or joint  swelling.  Skin: Denies redness, rashes, lesions or ulcercations.   No  other specific complaints in a complete review of systems (except as listed in HPI above).  Objective:   Physical Exam  BP 140/76   Pulse 62   Resp 18  Wt Readings from Last 3 Encounters:  08/15/19 197 lb 12.8 oz (89.7 kg)  06/30/19 201 lb 9.6 oz (91.4 kg)  04/19/19 192 lb 6.4 oz (87.3 kg)    General: Appears her stated age, in NAD. Skin: Warm, dry and intact. No rashes noted. Cardiovascular: Normal rate and rhythm. Pulmonary/Chest: Normal effort and positive vesicular breath sounds. No respiratory distress. No wheezes, rales or ronchi noted.  Abdomen: Soft and nontender. Normal bowel sounds.  Musculoskeletal: Kyphotic. No pain with palpation of the left  side ribs. No bony tenderness noted over the spine. Gait not visualized. Neurological: Alert and oriented.    BMET    Component Value Date/Time   NA 141 02/25/2019 0545   NA 139 12/13/2012 2035   K 3.7 02/25/2019 0545   K 3.8 12/13/2012 2035   CL 109 02/25/2019 0545   CL 106 12/13/2012 2035   CO2 24 02/25/2019 0545   CO2 30 12/13/2012 2035   GLUCOSE 94 02/25/2019 0545   GLUCOSE 93 12/13/2012 2035   BUN 16 02/25/2019 0545   BUN 27 (H) 12/13/2012 2035   CREATININE 0.85 02/25/2019 0545   CREATININE 0.80 12/13/2012 2035   CALCIUM 8.8 (L) 02/25/2019 0545   CALCIUM 8.8 12/13/2012 2035   GFRNONAA 60 (L) 02/25/2019 0545   GFRNONAA >60 12/13/2012 2035   GFRAA >60 02/25/2019 0545   GFRAA >60 12/13/2012 2035    Lipid Panel     Component Value Date/Time   CHOL 118 07/21/2016 0421   TRIG 123 07/21/2016 0421   HDL 37 (L) 07/21/2016 0421   CHOLHDL 3.2 07/21/2016 0421   VLDL 25 07/21/2016 0421   LDLCALC 56 07/21/2016 0421    CBC    Component Value Date/Time   WBC 5.1 02/25/2019 0545   RBC 3.51 (L) 02/25/2019 0545   HGB 11.3 (L) 02/25/2019 0545   HGB 13.3 12/13/2012 2035   HCT 33.9 (L) 02/25/2019 0545   HCT 39.3 12/13/2012 2035   PLT 183 02/25/2019 0545   PLT 231 12/13/2012 2035   MCV 96.6 02/25/2019 0545   MCV 97 12/13/2012 2035   MCH 32.2 02/25/2019 0545   MCHC 33.3 02/25/2019 0545   RDW 12.1 02/25/2019 0545   RDW 13.3 12/13/2012 2035   LYMPHSABS 0.6 (L) 02/21/2019 1628   MONOABS 0.5 02/21/2019 1628   EOSABS 0.1 02/21/2019 1628   BASOSABS 0.0 02/21/2019 1628    Hgb A1C Lab Results  Component Value Date   HGBA1C 5.6 07/18/2016       Assessment & Plan:   Left Side Rib Pain:  Will obtain xray of left side of ribs Will obtain xray of thoracic back Can take Tylenol 500 mg TID prn  Will follow up after xrays, return precautions discussed Webb Silversmith, NP This visit occurred during the SARS-CoV-2 public health emergency.  Safety protocols were in place,  including screening questions prior to the visit, additional usage of staff PPE, and extensive cleaning of exam room while observing appropriate contact time as indicated for disinfecting solutions.

## 2019-08-30 ENCOUNTER — Other Ambulatory Visit: Payer: Self-pay | Admitting: Internal Medicine

## 2019-08-30 NOTE — Addendum Note (Signed)
Addended by: Jearld Fenton on: 08/30/2019 03:22 PM   Modules accepted: Orders

## 2019-09-05 ENCOUNTER — Other Ambulatory Visit: Payer: Medicare Other

## 2019-10-24 ENCOUNTER — Other Ambulatory Visit: Payer: Self-pay

## 2019-10-24 ENCOUNTER — Ambulatory Visit: Payer: Medicare Other | Admitting: Internal Medicine

## 2019-10-24 ENCOUNTER — Encounter: Payer: Self-pay | Admitting: Internal Medicine

## 2019-10-24 DIAGNOSIS — F39 Unspecified mood [affective] disorder: Secondary | ICD-10-CM | POA: Diagnosis not present

## 2019-10-24 DIAGNOSIS — E039 Hypothyroidism, unspecified: Secondary | ICD-10-CM

## 2019-10-24 DIAGNOSIS — I480 Paroxysmal atrial fibrillation: Secondary | ICD-10-CM

## 2019-10-24 DIAGNOSIS — I6523 Occlusion and stenosis of bilateral carotid arteries: Secondary | ICD-10-CM

## 2019-10-24 DIAGNOSIS — I272 Pulmonary hypertension, unspecified: Secondary | ICD-10-CM

## 2019-10-24 DIAGNOSIS — E441 Mild protein-calorie malnutrition: Secondary | ICD-10-CM

## 2019-10-24 DIAGNOSIS — M419 Scoliosis, unspecified: Secondary | ICD-10-CM

## 2019-10-24 DIAGNOSIS — E7800 Pure hypercholesterolemia, unspecified: Secondary | ICD-10-CM | POA: Diagnosis not present

## 2019-10-24 DIAGNOSIS — I10 Essential (primary) hypertension: Secondary | ICD-10-CM

## 2019-10-24 DIAGNOSIS — K219 Gastro-esophageal reflux disease without esophagitis: Secondary | ICD-10-CM

## 2019-10-24 DIAGNOSIS — I5032 Chronic diastolic (congestive) heart failure: Secondary | ICD-10-CM

## 2019-10-24 NOTE — Assessment & Plan Note (Signed)
Continue current meds 

## 2019-10-24 NOTE — Assessment & Plan Note (Signed)
No longer needs statin

## 2019-10-24 NOTE — Assessment & Plan Note (Signed)
Currently not an issue 

## 2019-10-24 NOTE — Progress Notes (Signed)
Subjective:    Patient ID: April Barrett, female    DOB: 10/26/1927, 86 y.o.   MRN: [id number]  HPI  Asked to see resident in [address] for routine followup Reviewed with RN, no new concerns. Resident reports some intermittent left upper back pain- has scoliosis. Resident is very satisfied. She sleeps well with the use of Trazadone. She walks with a rollater- chronic DOE but no chest pain. She has an aide that comes help her 2 x week with showers, otherwise independent of ADL's. Her appetite is poor, she is just not hungry. Her weight is up 2.5 lbs. She denies pain or reflux .  Hypothyroidism: She denies any issues on her current dose of Levthyroxine.  Afib: Managed on Diltiazem, Metoprolol and ASA.  Insomnia: Sleeping well with Trazadone.  CAD: No angina. Managed on Metoprolol and ASA.  HTN: BP controlled on Furosemide, Metoprolol and Diltiazem.  GERD: Currently not an issue.  CHF, Diastolic: Some DOE, peripheral edema. She is taking Furosemide, Potassium and Metoprolol as prescribed.  Review of Systems  Past Medical History:  Diagnosis Date  . Aortic stenosis   . Carotid artery disease (HCC)   . Chronic diastolic heart failure (HCC)   . Coronary atherosclerosis of native coronary artery   . Essential hypertension, benign   . Gastroesophageal reflux disease   . History of kidney stones   . Hyperlipidemia   . Hypothyroidism   . Paroxysmal atrial fibrillation (HCC)   . Scoliosis     Current Outpatient Medications  Medication Sig Dispense Refill  . acetaminophen (TYLENOL) 500 MG tablet Take 500 mg by mouth every 8 (eight) hours as needed for mild pain or fever.    Marland Kitchen aspirin EC 81 MG tablet Take 81 mg by mouth daily.     . calcium carbonate (TUMS - DOSED IN MG ELEMENTAL CALCIUM) 500 MG chewable tablet Chew 2 tablets by mouth 2 (two) times daily as needed.     . Cholecalciferol (VITAMIN D-1000 MAX ST) 1000 units tablet Take 1,000 Units by mouth daily.     .  Cyanocobalamin (RA VITAMIN B-12 TR) 1000 MCG TBCR Take 1,000 mcg by mouth daily.     Marland Kitchen diltiazem (TIAZAC) 180 MG 24 hr capsule Take 180 mg by mouth daily.     . furosemide (LASIX) 40 MG tablet Take 40 mg by mouth 2 (two) times daily.     Marland Kitchen levothyroxine (SYNTHROID, LEVOTHROID) 100 MCG tablet Take 100 mcg by mouth daily.   1  . metoprolol succinate (TOPROL-XL) 100 MG 24 hr tablet Take 100 mg by mouth 2 (two) times daily.   3  . potassium chloride (KLOR-CON 10) 10 MEQ tablet Take 10 mEq by mouth 2 (two) times daily.     . sennosides-docusate sodium (SENOKOT-S) 8.6-50 MG tablet Take 2 tablets by mouth daily.     No current facility-administered medications for this visit.    Allergies  Allergen Reactions  . Ace Inhibitors Cough  . Amlodipine Swelling  . Statins Other (See Comments)    Family History  Problem Relation Age of Onset  . Hypertension Mother   . AAA (abdominal aortic aneurysm) Mother   . Stroke Mother   . Alzheimer's disease Father   . Prostate cancer Father   . Asthma Sister   . Hypertension Maternal Grandmother   . Colon cancer Maternal Grandmother   . Breast cancer Paternal Aunt     Social History   Socioeconomic History  . Marital status: Widowed  Spouse name: Not on file  . Number of children: 2  . Years of education: Not on file  . Highest education level: Not on file  Occupational History  . Not on file  Tobacco Use  . Smoking status: Unknown If Ever Smoked  . Smokeless tobacco: Never Used  Substance and Sexual Activity  . Alcohol use: No    Alcohol/week: 0.0 standard drinks  . Drug use: Not on file  . Sexual activity: Not on file  Other Topics Concern  . Not on file  Social History Narrative   Widowed 2007   2 daughters----[place] and [place]      Has living will   Daughter Manuela Schwartz is health care POA   Has DNR   Would accept hospital but no tube feeds   Social Determinants of Health   Financial Resource Strain:   . Difficulty of  Paying Living Expenses: Not on file  Food Insecurity:   . Worried About Charity fundraiser in the Last Year: Not on file  . Ran Out of Food in the Last Year: Not on file  Transportation Needs:   . Lack of Transportation (Medical): Not on file  . Lack of Transportation (Non-Medical): Not on file  Physical Activity:   . Days of Exercise per Week: Not on file  . Minutes of Exercise per Session: Not on file  Stress:   . Feeling of Stress : Not on file  Social Connections:   . Frequency of Communication with Friends and Family: Not on file  . Frequency of Social Gatherings with Friends and Family: Not on file  . Attends Religious Services: Not on file  . Active Member of Clubs or Organizations: Not on file  . Attends Archivist Meetings: Not on file  . Marital Status: Not on file  Intimate Partner Violence:   . Fear of Current or Ex-Partner: Not on file  . Emotionally Abused: Not on file  . Physically Abused: Not on file  . Sexually Abused: Not on file     Constitutional: Pt reports fatigue. Denies fever, malaise,  headache or abrupt weight changes.  HEENT: Denies eye pain, eye redness, ear pain, ringing in the ears, wax buildup, runny nose, nasal congestion, bloody nose, or sore throat. Respiratory: Pt reports dyspnea on exertion, swelling in legs. Denies difficulty breathing, cough or sputum production.   Cardiovascular: Denies chest pain, chest tightness, palpitations or swelling in the hands or feet.  Gastrointestinal: Denies abdominal pain, bloating, constipation, diarrhea or blood in the stool.  GU: Denies urgency, frequency, pain with urination, burning sensation, blood in urine, odor or discharge. Musculoskeletal: Pt reports left upper back pain. Denies decrease in range of motion, difficulty with gait,  or joint pain and swelling.  Skin: Denies redness, rashes, lesions or ulcercations.  Neurological: Denies dizziness, difficulty with memory, difficulty with speech or  problems with balance and coordination.  Psych: Denies anxiety, depression, SI/HI.  No other specific complaints in a complete review of systems (except as listed in HPI above).     Objective:   Physical Exam  BP 136/67   Pulse 69   Temp (!) 97.4 F (36.3 C)   Resp 16   Wt 105 lb (47.6 kg)   BMI 21.95 kg/m  Wt Readings from Last 3 Encounters:  10/24/19 105 lb (47.6 kg)  08/09/19 102 lb 9.6 oz (46.5 kg)  05/21/19 110 lb (49.9 kg)    General: Appears her stated age,  in NAD. Skin:  Warm, dry and intact. No ulcerations noted. HEENT: Head: normal shape and size; Eyes: sclera white, no icterus, conjunctiva pink, EOMs intact;  Neck:  Neck supple, trachea midline. No masses, lumps  present.  Cardiovascular: Normal rate with irregular rhythm. Murmur noted. Trace BLE edema.  Pulmonary/Chest: Normal effort and positive vesicular breath sounds. No respiratory distress. No wheezes, rales or ronchi noted.  Abdomen: Soft and nontender. Normal bowel sounds. No distention or masses noted. Liver, spleen and kidneys non palpable. Musculoskeletal: Gait slow and steady with use of rolling walker. Neurological: Alert and oriented.  Psychiatric: Mood and affect normal. Behavior is normal. Judgment and thought content normal.    BMET    Component Value Date/Time   NA 139 02/17/2014 0407   K 3.6 02/17/2014 0407   CL 103 02/17/2014 0407   CO2 33 (H) 02/17/2014 0407   GLUCOSE 80 02/17/2014 0407   BUN 16 02/17/2014 0407   CREATININE 0.82 02/17/2014 0407   CALCIUM 8.9 02/17/2014 0407   GFRNONAA >60 02/17/2014 0407   GFRAA >60 02/17/2014 0407    Lipid Panel     Component Value Date/Time   CHOL 168 02/17/2014 0407   TRIG 97 02/17/2014 0407   HDL 40 02/17/2014 0407   VLDL 19 02/17/2014 0407   LDLCALC 109 (H) 02/17/2014 0407    CBC    Component Value Date/Time   WBC 4.4 02/17/2014 0407   RBC 4.05 02/17/2014 0407   HGB 12.4 02/17/2014 0407   HCT 37.6 02/17/2014 0407   PLT 168  02/17/2014 0407   MCV 93 02/17/2014 0407   MCH 30.6 02/17/2014 0407   MCHC 33.0 02/17/2014 0407   RDW 14.9 (H) 02/17/2014 0407   LYMPHSABS 0.5 (L) 02/17/2014 0407   MONOABS 0.7 02/17/2014 0407   EOSABS 0.1 02/17/2014 0407   BASOSABS 0.0 02/17/2014 0407    Hgb A1C Lab Results  Component Value Date   HGBA1C 5.8 02/16/2014            Assessment & Plan:

## 2019-10-24 NOTE — Assessment & Plan Note (Signed)
Continue Diltiazem, Metoprolol and ASA

## 2019-10-24 NOTE — Assessment & Plan Note (Signed)
Moderate, now having muscle spasms Seem to resolve without intervention Will monitor

## 2019-10-24 NOTE — Assessment & Plan Note (Signed)
Compensated Continue Metoprolol, Furosemide and Potassium

## 2019-10-24 NOTE — Assessment & Plan Note (Deleted)
No angina Continue Metoprolol and ASA

## 2019-10-24 NOTE — Assessment & Plan Note (Signed)
Monitor yearly Free T4 Continue Levothyroxine for now

## 2019-10-24 NOTE — Patient Instructions (Signed)
Heart Failure and Exercise Heart failure is a condition in which the heart does not fill or pump enough blood and oxygen to support your body and its functions. Heart failure is a long-term (chronic) condition. Living with heart failure can be challenging. However, following your health care provider's instructions about a healthy lifestyle may help improve your symptoms. This includes choosing the right exercise plan. Doing daily physical activity is important after a diagnosis of heart failure. You may have some activity restrictions, so talk to your health care provider before doing any exercises. What are the benefits of exercise? Exercise may:  Make your heart muscles stronger.  Lower your blood pressure.  Lower your cholesterol.  Help you lose weight.  Help your bones stay strong.  Improve your blood circulation.  Help your body use oxygen better. This relieves symptoms such as fatigue and shortness of breath.  Help your mental health by lowering the risk of depression and other problems.  Improve your quality of life.  Decrease your chance of hospital admission for heart failure. What is an exercise plan? An exercise plan is a set of specific exercises and training activities. You will work with your health care provider to create the exercise plan that works for you. The plan may include:  Different types of exercises and how to do them.  Cardiac rehabilitation exercises. These are supervised programs that are designed to strengthen your heart. What are strengthening exercises? Strengthening exercises are a type of physical activity that involves using resistance to improve your muscle strength. Strengthening exercises usually have repetitive motions. These types of exercises can include:  Lifting weights.  Using weight machines.  Using resistance tubes and bands.  Using kettlebells.  Using your body weight, such as doing push-ups or squats. What are balance  exercises? Balance exercises are another type of physical activity. They strengthen the muscles of the back, stomach, and pelvis (core muscles) and improve your balance. They can also lower your risk of falling. These types of exercises can include:  Standing on one leg.  Walking backward, sideways, and in a straight line.  Standing up after sitting, without using your hands.  Shifting your weight from one leg to the other.  Lifting one leg in front of you.  Doing tai chi. This is a type of exercise that uses slow movements and deep breathing. How can I increase my flexibility? Having better flexibility can keep you from falling. It can also lengthen your muscles, improve your range of motion, and help your joints. You can increase your flexibility by:  Doing tai chi.  Doing yoga.  Stretching. How much aerobic exercise should I get?  Aerobic exercises strengthen your breathing and circulation system and increase your body's use of oxygen. Examples of aerobic exercise include biking, walking, running, and swimming. Talk to your health care provider to find out how much aerobic exercise is safe for you.  To do these exercises:  Start exercising slowly, limiting the amount of time at first. You may need to start with 5 minutes of aerobic exercise every day.  Slowly add more minutes until you can safely do at least 30 minutes of exercise at least 4 days a week. Summary  Daily physical activity is important after a diagnosis of heart failure.  Exercise can make your heart muscles stronger. It also offers other benefits that will improve your health.  Talk to your health care provider before doing any exercises. This information is not intended to replace advice  given to you by your health care provider. Make sure you discuss any questions you have with your health care provider. Document Revised: 01/28/2017 Document Reviewed: 01/25/2017 Elsevier Patient Education  [place].

## 2019-10-24 NOTE — Assessment & Plan Note (Signed)
Weight improving  

## 2019-11-19 ENCOUNTER — Telehealth: Payer: Self-pay

## 2019-11-19 MED ORDER — FUROSEMIDE 40 MG PO TABS: 40.0000 mg | ORAL_TABLET | Freq: Two times a day (BID) | ORAL | 11 refills | Status: DC

## 2019-11-19 MED ORDER — METOPROLOL SUCCINATE ER 100 MG PO TB24: 100.0000 mg | ORAL_TABLET | Freq: Two times a day (BID) | ORAL | 11 refills | Status: AC

## 2019-11-19 MED ORDER — DILTIAZEM HCL ER BEADS 180 MG PO CP24: 180.0000 mg | ORAL_CAPSULE | Freq: Every day | ORAL | 11 refills | Status: AC

## 2019-11-19 MED ORDER — TRAZODONE HCL 50 MG PO TABS: 50.0000 mg | ORAL_TABLET | Freq: Every day | ORAL | 11 refills | Status: AC

## 2019-11-19 MED ORDER — POTASSIUM CHLORIDE ER 10 MEQ PO TBCR: 10.0000 meq | EXTENDED_RELEASE_TABLET | Freq: Two times a day (BID) | ORAL | 11 refills | Status: AC

## 2019-11-19 MED ORDER — LEVOTHYROXINE SODIUM 100 MCG PO TABS: 100.0000 ug | ORAL_TABLET | Freq: Every day | ORAL | 11 refills | Status: AC

## 2019-11-19 NOTE — Telephone Encounter (Signed)
Received faxes from [facility] for her medications. Called Magda Paganini at [facility] to verifiy she is not using [facility] for her medications. She said that the pt will start getting them from [facility] at the pt's request. Magda Paganini will call [facility] to cancel her orders with them.

## 2020-01-11 ENCOUNTER — Other Ambulatory Visit: Payer: Self-pay | Admitting: Internal Medicine

## 2020-01-11 ENCOUNTER — Ambulatory Visit: Payer: Medicare Other | Admitting: Internal Medicine

## 2020-01-11 VITALS — BP 127/69 | HR 68 | Temp 97.9°F | Resp 17 | Wt 104.4 lb

## 2020-01-11 DIAGNOSIS — G479 Sleep disorder, unspecified: Secondary | ICD-10-CM

## 2020-01-11 DIAGNOSIS — F419 Anxiety disorder, unspecified: Secondary | ICD-10-CM | POA: Diagnosis not present

## 2020-01-15 ENCOUNTER — Encounter: Payer: Self-pay | Admitting: Internal Medicine

## 2020-01-15 NOTE — Patient Instructions (Signed)

## 2020-01-15 NOTE — Progress Notes (Signed)
Subjective:    Patient ID: April Barrett, female    DOB: Aug 27, 1928, 86 y.o.   MRN: [id number]  HPI  Asked to check resident in assisted living appt Resident has some concerns about not sleeping well and anxiety. She also reports some intermittent SOB but thinks this is anxiety related. She has trouble falling asleep and staying asleep. She is currently taking Trazadone 25 mg at night. She has had some anxiety but denies depression. She is not sure why she is so anxious, she is very happy at [facility].  Review of Systems      Past Medical History:  Diagnosis Date  . Aortic stenosis   . Carotid artery disease ([place])   . Chronic diastolic heart failure ([place])   . Coronary atherosclerosis of native coronary artery   . Essential hypertension, benign   . Gastroesophageal reflux disease   . History of kidney stones   . Hyperlipidemia   . Hypothyroidism   . Paroxysmal atrial fibrillation ([facility])   . Scoliosis     Current Outpatient Medications  Medication Sig Dispense Refill  . acetaminophen (TYLENOL) 500 MG tablet Take 500 mg by mouth every 8 (eight) hours as needed for mild pain or fever.    Marland Kitchen aspirin EC 81 MG tablet Take 81 mg by mouth daily.     . calcium carbonate (TUMS - DOSED IN MG ELEMENTAL CALCIUM) 500 MG chewable tablet Chew 2 tablets by mouth 2 (two) times daily as needed.     . Cholecalciferol (VITAMIN D-1000 MAX ST) 1000 units tablet Take 1,000 Units by mouth daily.     . Cyanocobalamin (RA VITAMIN B-12 TR) 1000 MCG TBCR Take 1,000 mcg by mouth daily.     Marland Kitchen diltiazem (TIAZAC) 180 MG 24 hr capsule Take 1 capsule (180 mg total) by mouth daily. 30 capsule 11  . furosemide (LASIX) 40 MG tablet Take 1 tablet (40 mg total) by mouth 2 (two) times daily. 60 tablet 11  . levothyroxine (SYNTHROID) 100 MCG tablet Take 1 tablet (100 mcg total) by mouth daily. 30 tablet 11  . metoprolol succinate (TOPROL-XL) 100 MG 24 hr tablet Take 1 tablet (100 mg total) by mouth 2 (two) times  daily. 60 tablet 11  . potassium chloride (KLOR-CON 10) 10 MEQ tablet Take 1 tablet (10 mEq total) by mouth 2 (two) times daily. 60 tablet 11  . sennosides-docusate sodium (SENOKOT-S) 8.6-50 MG tablet Take 2 tablets by mouth daily.    Marland Kitchen escitalopram (LEXAPRO) 5 MG tablet TAKE ONE TABLET BY MOUTH EVERY MORNING 30 tablet 5  . traZODone (DESYREL) 50 MG tablet Take 1 tablet (50 mg total) by mouth at bedtime. 30 tablet 11   No current facility-administered medications for this visit.    Allergies  Allergen Reactions  . Ace Inhibitors Cough  . Amlodipine Swelling  . Statins Other (See Comments)    Family History  Problem Relation Age of Onset  . Hypertension Mother   . AAA (abdominal aortic aneurysm) Mother   . Stroke Mother   . Alzheimer's disease Father   . Prostate cancer Father   . Asthma Sister   . Hypertension Maternal Grandmother   . Colon cancer Maternal Grandmother   . Breast cancer Paternal Aunt     Social History   Socioeconomic History  . Marital status: Widowed    Spouse name: Not on file  . Number of children: 2  . Years of education: Not on file  . Highest education level:  Not on file  Occupational History  . Not on file  Tobacco Use  . Smoking status: Unknown If Ever Smoked  . Smokeless tobacco: Never Used  Substance and Sexual Activity  . Alcohol use: No    Alcohol/week: 0.0 standard drinks  . Drug use: Not on file  . Sexual activity: Not on file  Other Topics Concern  . Not on file  Social History Narrative   Widowed 2007   2 daughters----Harold and Claris Gower      Has living will   Daughter Darl Pikes is health care POA   Has DNR   Would accept hospital but no tube feeds   Social Determinants of Health   Financial Resource Strain:   . Difficulty of Paying Living Expenses:   Food Insecurity:   . Worried About Programme researcher, broadcasting/film/video in the Last Year:   . Barista in the Last Year:   Transportation Needs:   . Freight forwarder  (Medical):   Marland Kitchen Lack of Transportation (Non-Medical):   Physical Activity:   . Days of Exercise per Week:   . Minutes of Exercise per Session:   Stress:   . Feeling of Stress :   Social Connections:   . Frequency of Communication with Friends and Family:   . Frequency of Social Gatherings with Friends and Family:   . Attends Religious Services:   . Active Member of Clubs or Organizations:   . Attends Banker Meetings:   Marland Kitchen Marital Status:   Intimate Partner Violence:   . Fear of Current or Ex-Partner:   . Emotionally Abused:   Marland Kitchen Physically Abused:   . Sexually Abused:      Constitutional: Denies fever, malaise, fatigue, headache or abrupt weight changes.  Respiratory: Pt reports intermittent SOB. Denies difficulty breathing, shortness of breath, cough or sputum production.   Cardiovascular: Denies chest pain, chest tightness, palpitations or swelling in the hands or feet.  Neurological: Pt reports difficulty sleeping. Denies dizziness, difficulty with memory, difficulty with speech or problems with balance and coordination.  Psych:Pt reports anxiety. Denies depression, SI/HI.  No other specific complaints in a complete review of systems (except as listed in HPI above).  Objective:   Physical Exam  BP 127/69   Pulse 68   Temp 97.9 F (36.6 C)   Resp 17   Wt 104 lb 6.4 oz (47.4 kg)   BMI 21.82 kg/m  Wt Readings from Last 3 Encounters:  01/15/20 104 lb 6.4 oz (47.4 kg)  10/24/19 105 lb (47.6 kg)  08/09/19 102 lb 9.6 oz (46.5 kg)    General: Appears her stated age, well developed, well nourished in NAD. Cardiovascular: Normal rate and rhythm.  Pulmonary/Chest: Normal effort and positive vesicular breath sounds. No respiratory distress. No wheezes, rales or ronchi noted.  Neurological: Alert and oriented. Marland Kitchen  Psychiatric: Mood and affect normal. Behavior is normal. Judgment and thought content normal.  BMET    Component Value Date/Time   NA 139 02/17/2014  0407   K 3.6 02/17/2014 0407   CL 103 02/17/2014 0407   CO2 33 (H) 02/17/2014 0407   GLUCOSE 80 02/17/2014 0407   BUN 16 02/17/2014 0407   CREATININE 0.82 02/17/2014 0407   CALCIUM 8.9 02/17/2014 0407   GFRNONAA >60 02/17/2014 0407   GFRAA >60 02/17/2014 0407    Lipid Panel     Component Value Date/Time   CHOL 168 02/17/2014 0407   TRIG 97 02/17/2014 0407   HDL  40 02/17/2014 0407   VLDL 19 02/17/2014 0407   LDLCALC 109 (H) 02/17/2014 0407    CBC    Component Value Date/Time   WBC 4.4 02/17/2014 0407   RBC 4.05 02/17/2014 0407   HGB 12.4 02/17/2014 0407   HCT 37.6 02/17/2014 0407   PLT 168 02/17/2014 0407   MCV 93 02/17/2014 0407   MCH 30.6 02/17/2014 0407   MCHC 33.0 02/17/2014 0407   RDW 14.9 (H) 02/17/2014 0407   LYMPHSABS 0.5 (L) 02/17/2014 0407   MONOABS 0.7 02/17/2014 0407   EOSABS 0.1 02/17/2014 0407   BASOSABS 0.0 02/17/2014 0407    Hgb A1C Lab Results  Component Value Date   HGBA1C 5.8 02/16/2014            Assessment & Plan:   Insomnia, Anxiety:  Can increase Trazadone to 50 mg at bedtime Will add Escitalopram 5 mg PO daily for anxiety  Will reassess as needed Nicki Reaper, NP]This visit occurred during the SARS-CoV-2 public health emergency.  Safety protocols were in place, including screening questions prior to the visit, additional usage of staff PPE, and extensive cleaning of exam room while observing appropriate contact time as indicated for disinfecting solutions.

## 2020-01-17 ENCOUNTER — Ambulatory Visit: Payer: Medicare Other | Admitting: Internal Medicine

## 2020-01-17 ENCOUNTER — Other Ambulatory Visit: Payer: Self-pay

## 2020-01-17 ENCOUNTER — Encounter: Payer: Self-pay | Admitting: Internal Medicine

## 2020-01-17 VITALS — BP 139/74 | HR 84 | Temp 97.6°F | Resp 17 | Wt 110.2 lb

## 2020-01-17 DIAGNOSIS — M4100 Infantile idiopathic scoliosis, site unspecified: Secondary | ICD-10-CM

## 2020-01-17 DIAGNOSIS — F39 Unspecified mood [affective] disorder: Secondary | ICD-10-CM | POA: Diagnosis not present

## 2020-01-17 DIAGNOSIS — I5032 Chronic diastolic (congestive) heart failure: Secondary | ICD-10-CM | POA: Diagnosis not present

## 2020-01-17 DIAGNOSIS — I10 Essential (primary) hypertension: Secondary | ICD-10-CM

## 2020-01-17 DIAGNOSIS — I480 Paroxysmal atrial fibrillation: Secondary | ICD-10-CM

## 2020-01-17 MED ORDER — FUROSEMIDE 40 MG PO TABS: 80.0000 mg | ORAL_TABLET | Freq: Every day | ORAL | 0 refills | Status: AC

## 2020-01-17 NOTE — Assessment & Plan Note (Signed)
Hard to tell if her SOB is related to this worsening, or the anxiety Her edema is slightly more prominent Will change the furosemide to 80mg  daily ---since the 40mg  dose doesn't seem to be provoking diuresis

## 2020-01-17 NOTE — Progress Notes (Signed)
Subjective:    Patient ID: April Barrett, female    DOB: 09-20-1928, 86 y.o.   MRN: [id number]  HPI Visit in assisted living apartment for review of chronic health conditions Reviewed status with Luellen Pucker RN Reviewed recent visit with Avie Echevaria NP  Is having SOB "like landed me back in the hospital in 2015" Feels it even just sitting around---"not troubling to me" May be "more intense" when walking to dining room. Doesn't note problems with showering  Thinks her oximetry was 88%---but staff got 97% No PND Feels her edema is worse Doesn't notice diuresis with her furosemide No palpitaitons No dizziness or syncope  Some regular anxiety Feels her legs "twitter---makes it hard to stand up" Does walk okay with rollator  Current Outpatient Medications on File Prior to Visit  Medication Sig Dispense Refill  . acetaminophen (TYLENOL) 500 MG tablet Take 500 mg by mouth every 8 (eight) hours as needed for mild pain or fever.    Marland Kitchen aspirin EC 81 MG tablet Take 81 mg by mouth daily.     . calcium carbonate (TUMS - DOSED IN MG ELEMENTAL CALCIUM) 500 MG chewable tablet Chew 2 tablets by mouth 2 (two) times daily as needed.     . Cholecalciferol (VITAMIN D-1000 MAX ST) 1000 units tablet Take 1,000 Units by mouth daily.     . Cyanocobalamin (RA VITAMIN B-12 TR) 1000 MCG TBCR Take 1,000 mcg by mouth daily.     Marland Kitchen diltiazem (TIAZAC) 180 MG 24 hr capsule Take 1 capsule (180 mg total) by mouth daily. 30 capsule 11  . escitalopram (LEXAPRO) 5 MG tablet TAKE ONE TABLET BY MOUTH EVERY MORNING 30 tablet 5  . furosemide (LASIX) 40 MG tablet Take 1 tablet (40 mg total) by mouth 2 (two) times daily. 60 tablet 11  . levothyroxine (SYNTHROID) 100 MCG tablet Take 1 tablet (100 mcg total) by mouth daily. 30 tablet 11  . metoprolol succinate (TOPROL-XL) 100 MG 24 hr tablet Take 1 tablet (100 mg total) by mouth 2 (two) times daily. 60 tablet 11  . potassium chloride (KLOR-CON 10) 10 MEQ tablet Take 1 tablet (10  mEq total) by mouth 2 (two) times daily. 60 tablet 11  . sennosides-docusate sodium (SENOKOT-S) 8.6-50 MG tablet Take 2 tablets by mouth daily.    . traZODone (DESYREL) 50 MG tablet Take 1 tablet (50 mg total) by mouth at bedtime. 30 tablet 11   No current facility-administered medications on file prior to visit.    Allergies  Allergen Reactions  . Ace Inhibitors Cough  . Amlodipine Swelling  . Statins Other (See Comments)    Past Medical History:  Diagnosis Date  . Aortic stenosis   . Carotid artery disease ([place])   . Chronic diastolic heart failure ([place])   . Coronary atherosclerosis of native coronary artery   . Essential hypertension, benign   . Gastroesophageal reflux disease   . History of kidney stones   . Hyperlipidemia   . Hypothyroidism   . Paroxysmal atrial fibrillation ([facility])   . Scoliosis     Past Surgical History:  Procedure Laterality Date  . CATARACT EXTRACTION    . Childbirth  [phone]  . TTA      Family History  Problem Relation Age of Onset  . Hypertension Mother   . AAA (abdominal aortic aneurysm) Mother   . Stroke Mother   . Alzheimer's disease Father   . Prostate cancer Father   . Asthma Sister   . Hypertension  Maternal Grandmother   . Colon cancer Maternal Grandmother   . Breast cancer Paternal Aunt     Social History   Socioeconomic History  . Marital status: Widowed    Spouse name: Not on file  . Number of children: 2  . Years of education: Not on file  . Highest education level: Not on file  Occupational History  . Not on file  Tobacco Use  . Smoking status: Unknown If Ever Smoked  . Smokeless tobacco: Never Used  Substance and Sexual Activity  . Alcohol use: No    Alcohol/week: 0.0 standard drinks  . Drug use: Not on file  . Sexual activity: Not on file  Other Topics Concern  . Not on file  Social History Narrative   Widowed 2007   2 daughters----Florin and Claris Gower      Has living will   Daughter Darl Pikes is  health care POA   Has DNR   Would accept hospital but no tube feeds   Social Determinants of Health   Financial Resource Strain:   . Difficulty of Paying Living Expenses:   Food Insecurity:   . Worried About Programme researcher, broadcasting/film/video in the Last Year:   . Barista in the Last Year:   Transportation Needs:   . Freight forwarder (Medical):   Marland Kitchen Lack of Transportation (Non-Medical):   Physical Activity:   . Days of Exercise per Week:   . Minutes of Exercise per Session:   Stress:   . Feeling of Stress :   Social Connections:   . Frequency of Communication with Friends and Family:   . Frequency of Social Gatherings with Friends and Family:   . Attends Religious Services:   . Active Member of Clubs or Organizations:   . Attends Banker Meetings:   Marland Kitchen Marital Status:   Intimate Partner Violence:   . Fear of Current or Ex-Partner:   . Emotionally Abused:   Marland Kitchen Physically Abused:   . Sexually Abused:    Review of Systems Not sleeping that well--keeps HOB elevated.  Eating is not great---doesn't taste great Had some heartburn last night---no symptoms for a long time before that. Uses tums with relief    Objective:   Physical Exam  Constitutional: She appears well-developed. No distress.  Neck: No thyromegaly present.  Cardiovascular: Normal rate and normal heart sounds.  No murmur heard. irregular  Respiratory: Effort normal and breath sounds normal. No respiratory distress. She has no wheezes. She has no rales.  GI: Soft.  Musculoskeletal:     Comments: 1-2+ tense edema in calves (hose on)  Lymphadenopathy:    She has no cervical adenopathy.  Psychiatric:  Not depressed Voices mild anxiety           Assessment & Plan:

## 2020-01-17 NOTE — Assessment & Plan Note (Signed)
Clearly has anxiety escitalopram recently started On trazodone for sleep which is still not great Hopefully should be improving soon

## 2020-01-17 NOTE — Assessment & Plan Note (Signed)
Feels she is having trouble with her gait No clear pain No obvious leg length discrepancy Will ask PT to evaluate

## 2020-01-17 NOTE — Assessment & Plan Note (Signed)
Seems persistent now--but I don't think it was on recent holter by Dr Darrold Junker Rate is fine ASA only due to past falls

## 2020-01-17 NOTE — Assessment & Plan Note (Signed)
BP Readings from Last 3 Encounters:  01/17/20 139/74  01/15/20 127/69  10/24/19 136/67   Reasonable control

## 2020-01-21 ENCOUNTER — Encounter: Payer: Self-pay | Admitting: Emergency Medicine

## 2020-01-21 ENCOUNTER — Emergency Department: Payer: Medicare Other

## 2020-01-21 ENCOUNTER — Other Ambulatory Visit: Payer: Self-pay

## 2020-01-21 ENCOUNTER — Inpatient Hospital Stay: Payer: Medicare Other

## 2020-01-21 DIAGNOSIS — Z7982 Long term (current) use of aspirin: Secondary | ICD-10-CM

## 2020-01-21 DIAGNOSIS — Z800 Family history of malignant neoplasm of digestive organs: Secondary | ICD-10-CM

## 2020-01-21 DIAGNOSIS — E785 Hyperlipidemia, unspecified: Secondary | ICD-10-CM | POA: Diagnosis present

## 2020-01-21 DIAGNOSIS — I5021 Acute systolic (congestive) heart failure: Secondary | ICD-10-CM | POA: Diagnosis present

## 2020-01-21 DIAGNOSIS — I248 Other forms of acute ischemic heart disease: Secondary | ICD-10-CM | POA: Diagnosis present

## 2020-01-21 DIAGNOSIS — I480 Paroxysmal atrial fibrillation: Secondary | ICD-10-CM | POA: Diagnosis present

## 2020-01-21 DIAGNOSIS — E875 Hyperkalemia: Secondary | ICD-10-CM | POA: Diagnosis present

## 2020-01-21 DIAGNOSIS — I214 Non-ST elevation (NSTEMI) myocardial infarction: Secondary | ICD-10-CM | POA: Diagnosis present

## 2020-01-21 DIAGNOSIS — Z87442 Personal history of urinary calculi: Secondary | ICD-10-CM

## 2020-01-21 DIAGNOSIS — N179 Acute kidney failure, unspecified: Secondary | ICD-10-CM | POA: Diagnosis present

## 2020-01-21 DIAGNOSIS — K219 Gastro-esophageal reflux disease without esophagitis: Secondary | ICD-10-CM | POA: Diagnosis present

## 2020-01-21 DIAGNOSIS — J9601 Acute respiratory failure with hypoxia: Secondary | ICD-10-CM | POA: Diagnosis present

## 2020-01-21 DIAGNOSIS — I350 Nonrheumatic aortic (valve) stenosis: Secondary | ICD-10-CM | POA: Diagnosis present

## 2020-01-21 DIAGNOSIS — E039 Hypothyroidism, unspecified: Secondary | ICD-10-CM | POA: Diagnosis present

## 2020-01-21 DIAGNOSIS — I272 Pulmonary hypertension, unspecified: Secondary | ICD-10-CM | POA: Diagnosis present

## 2020-01-21 DIAGNOSIS — Z803 Family history of malignant neoplasm of breast: Secondary | ICD-10-CM

## 2020-01-21 DIAGNOSIS — Z66 Do not resuscitate: Secondary | ICD-10-CM | POA: Diagnosis present

## 2020-01-21 DIAGNOSIS — I2510 Atherosclerotic heart disease of native coronary artery without angina pectoris: Secondary | ICD-10-CM | POA: Diagnosis present

## 2020-01-21 DIAGNOSIS — I110 Hypertensive heart disease with heart failure: Principal | ICD-10-CM | POA: Diagnosis present

## 2020-01-21 DIAGNOSIS — I5043 Acute on chronic combined systolic (congestive) and diastolic (congestive) heart failure: Secondary | ICD-10-CM | POA: Diagnosis present

## 2020-01-21 DIAGNOSIS — F0390 Unspecified dementia without behavioral disturbance: Secondary | ICD-10-CM | POA: Diagnosis present

## 2020-01-21 DIAGNOSIS — Z515 Encounter for palliative care: Secondary | ICD-10-CM | POA: Diagnosis not present

## 2020-01-21 DIAGNOSIS — E871 Hypo-osmolality and hyponatremia: Secondary | ICD-10-CM | POA: Diagnosis present

## 2020-01-21 DIAGNOSIS — R7989 Other specified abnormal findings of blood chemistry: Secondary | ICD-10-CM | POA: Diagnosis present

## 2020-01-21 DIAGNOSIS — Z823 Family history of stroke: Secondary | ICD-10-CM

## 2020-01-21 DIAGNOSIS — Z79890 Hormone replacement therapy (postmenopausal): Secondary | ICD-10-CM

## 2020-01-21 DIAGNOSIS — R739 Hyperglycemia, unspecified: Secondary | ICD-10-CM | POA: Diagnosis present

## 2020-01-21 DIAGNOSIS — I452 Bifascicular block: Secondary | ICD-10-CM | POA: Diagnosis present

## 2020-01-21 DIAGNOSIS — Z20822 Contact with and (suspected) exposure to covid-19: Secondary | ICD-10-CM | POA: Diagnosis present

## 2020-01-21 DIAGNOSIS — D72829 Elevated white blood cell count, unspecified: Secondary | ICD-10-CM | POA: Diagnosis present

## 2020-01-21 DIAGNOSIS — Z8249 Family history of ischemic heart disease and other diseases of the circulatory system: Secondary | ICD-10-CM

## 2020-01-21 DIAGNOSIS — I255 Ischemic cardiomyopathy: Secondary | ICD-10-CM | POA: Diagnosis present

## 2020-01-21 DIAGNOSIS — Z8719 Personal history of other diseases of the digestive system: Secondary | ICD-10-CM

## 2020-01-21 DIAGNOSIS — I4891 Unspecified atrial fibrillation: Secondary | ICD-10-CM

## 2020-01-21 DIAGNOSIS — Z79899 Other long term (current) drug therapy: Secondary | ICD-10-CM

## 2020-01-21 DIAGNOSIS — Z888 Allergy status to other drugs, medicaments and biological substances status: Secondary | ICD-10-CM

## 2020-01-21 DIAGNOSIS — Z820 Family history of epilepsy and other diseases of the nervous system: Secondary | ICD-10-CM

## 2020-01-21 DIAGNOSIS — I509 Heart failure, unspecified: Secondary | ICD-10-CM

## 2020-01-21 DIAGNOSIS — Z825 Family history of asthma and other chronic lower respiratory diseases: Secondary | ICD-10-CM

## 2020-01-21 LAB — BASIC METABOLIC PANEL
Anion gap: 9 (ref 5–15)
BUN: 45 mg/dL — ABNORMAL HIGH (ref 8–23)
CO2: 24 mmol/L (ref 22–32)
Calcium: 9.3 mg/dL (ref 8.9–10.3)
Chloride: 96 mmol/L — ABNORMAL LOW (ref 98–111)
Creatinine, Ser: 1.31 mg/dL — ABNORMAL HIGH (ref 0.44–1.00)
GFR calc Af Amer: 42 mL/min — ABNORMAL LOW (ref >60)
GFR calc non Af Amer: 36 mL/min — ABNORMAL LOW (ref >60)
Glucose, Bld: 100 mg/dL — ABNORMAL HIGH (ref 70–99)
Potassium: 4.8 mmol/L (ref 3.5–5.1)
Sodium: 129 mmol/L — ABNORMAL LOW (ref 135–145)

## 2020-01-21 LAB — RESPIRATORY PANEL BY RT PCR (FLU A&B, COVID)
Influenza A by PCR: NEGATIVE
Influenza B by PCR: NEGATIVE
SARS Coronavirus 2 by RT PCR: NEGATIVE

## 2020-01-21 LAB — COMPREHENSIVE METABOLIC PANEL
ALT: 274 U/L — ABNORMAL HIGH (ref 0–44)
AST: 195 U/L — ABNORMAL HIGH (ref 15–41)
Albumin: 3.3 g/dL — ABNORMAL LOW (ref 3.5–5.0)
Alkaline Phosphatase: 139 U/L — ABNORMAL HIGH (ref 38–126)
Anion gap: 14 (ref 5–15)
BUN: 46 mg/dL — ABNORMAL HIGH (ref 8–23)
CO2: 19 mmol/L — ABNORMAL LOW (ref 22–32)
Calcium: 9.3 mg/dL (ref 8.9–10.3)
Chloride: 91 mmol/L — ABNORMAL LOW (ref 98–111)
Creatinine, Ser: 1.79 mg/dL — ABNORMAL HIGH (ref 0.44–1.00)
GFR calc Af Amer: 29 mL/min — ABNORMAL LOW (ref >60)
GFR calc non Af Amer: 25 mL/min — ABNORMAL LOW (ref >60)
Glucose, Bld: 288 mg/dL — ABNORMAL HIGH (ref 70–99)
Potassium: 5.8 mmol/L — ABNORMAL HIGH (ref 3.5–5.1)
Sodium: 124 mmol/L — ABNORMAL LOW (ref 135–145)
Total Bilirubin: 1.2 mg/dL (ref 0.3–1.2)
Total Protein: 6.4 g/dL — ABNORMAL LOW (ref 6.5–8.1)

## 2020-01-21 LAB — HEMOGLOBIN A1C
Hgb A1c MFr Bld: 5.4 % (ref 4.8–5.6)
Mean Plasma Glucose: 108.28 mg/dL

## 2020-01-21 LAB — CBC WITH DIFFERENTIAL/PLATELET
Abs Immature Granulocytes: 0.13 10*3/uL — ABNORMAL HIGH (ref 0.00–0.07)
Basophils Absolute: 0 10*3/uL (ref 0.0–0.1)
Basophils Relative: 0 %
Eosinophils Absolute: 0 10*3/uL (ref 0.0–0.5)
Eosinophils Relative: 0 %
HCT: 37.9 % (ref 36.0–46.0)
Hemoglobin: 12.5 g/dL (ref 12.0–15.0)
Immature Granulocytes: 1 %
Lymphocytes Relative: 5 %
Lymphs Abs: 0.7 10*3/uL (ref 0.7–4.0)
MCH: 31.6 pg (ref 26.0–34.0)
MCHC: 33 g/dL (ref 30.0–36.0)
MCV: 95.7 fL (ref 80.0–100.0)
Monocytes Absolute: 1.4 10*3/uL — ABNORMAL HIGH (ref 0.1–1.0)
Monocytes Relative: 12 %
Neutro Abs: 10.1 10*3/uL — ABNORMAL HIGH (ref 1.7–7.7)
Neutrophils Relative %: 82 %
Platelets: 194 10*3/uL (ref 150–400)
RBC: 3.96 MIL/uL (ref 3.87–5.11)
RDW: 14.2 % (ref 11.5–15.5)
WBC: 12.4 10*3/uL — ABNORMAL HIGH (ref 4.0–10.5)
nRBC: 0 % (ref 0.0–0.2)

## 2020-01-21 LAB — URINALYSIS, COMPLETE (UACMP) WITH MICROSCOPIC
Bacteria, UA: NONE SEEN
Bilirubin Urine: NEGATIVE
Glucose, UA: NEGATIVE mg/dL
Ketones, ur: NEGATIVE mg/dL
Leukocytes,Ua: NEGATIVE
Nitrite: NEGATIVE
Specific Gravity, Urine: 1.02 (ref 1.005–1.030)
pH: 5 (ref 5.0–8.0)

## 2020-01-21 LAB — BRAIN NATRIURETIC PEPTIDE: B Natriuretic Peptide: 2573 pg/mL — ABNORMAL HIGH (ref 0.0–100.0)

## 2020-01-21 LAB — PROCALCITONIN: Procalcitonin: 0.69 ng/mL

## 2020-01-21 LAB — GLUCOSE, CAPILLARY
Glucose-Capillary: 99 mg/dL (ref 70–99)
Glucose-Capillary: 94 mg/dL (ref 70–99)
Glucose-Capillary: 70 mg/dL (ref 70–99)
Glucose-Capillary: 80 mg/dL (ref 70–99)

## 2020-01-21 LAB — MAGNESIUM: Magnesium: 2.4 mg/dL (ref 1.7–2.4)

## 2020-01-21 LAB — TSH: TSH: 3.995 u[IU]/mL (ref 0.350–4.500)

## 2020-01-21 LAB — TROPONIN I (HIGH SENSITIVITY)
Troponin I (High Sensitivity): 575 ng/L (ref <18)
Troponin I (High Sensitivity): 734 ng/L (ref <18)
Troponin I (High Sensitivity): 934 ng/L (ref <18)
Troponin I (High Sensitivity): 950 ng/L (ref <18)

## 2020-01-21 LAB — MRSA PCR SCREENING: MRSA by PCR: NEGATIVE

## 2020-01-21 MED ORDER — ESCITALOPRAM OXALATE 10 MG PO TABS
5.0000 mg | ORAL_TABLET | Freq: Every morning | ORAL | Status: DC
  Administered 2020-01-22: 5 mg via ORAL
  Filled 2020-01-21: qty 0.5

## 2020-01-21 MED ORDER — ASPIRIN EC 325 MG PO TBEC
325.0000 mg | DELAYED_RELEASE_TABLET | Freq: Every day | ORAL | Status: DC
  Administered 2020-01-22: 325 mg via ORAL
  Filled 2020-01-21: qty 1

## 2020-01-21 MED ORDER — INSULIN ASPART 100 UNIT/ML ~~LOC~~ SOLN
0.0000 [IU] | SUBCUTANEOUS | Status: DC
  Administered 2020-01-22: 2 [IU] via SUBCUTANEOUS
  Filled 2020-01-21: qty 1

## 2020-01-21 MED ORDER — DEXTROSE 50 % IV SOLN
25.0000 g | Freq: Once | INTRAVENOUS | Status: AC
  Administered 2020-01-21: 25 g via INTRAVENOUS
  Filled 2020-01-21: qty 50

## 2020-01-21 MED ORDER — ASPIRIN EC 325 MG PO TBEC: 325.0000 mg | DELAYED_RELEASE_TABLET | Freq: Every day | ORAL | Status: DC

## 2020-01-21 MED ORDER — DOCUSATE SODIUM 100 MG PO CAPS: 100.0000 mg | ORAL_CAPSULE | Freq: Two times a day (BID) | ORAL | Status: DC | PRN

## 2020-01-21 MED ORDER — FUROSEMIDE 10 MG/ML IJ SOLN
80.0000 mg | Freq: Once | INTRAMUSCULAR | Status: AC
  Administered 2020-01-21: 80 mg via INTRAVENOUS
  Filled 2020-01-21: qty 8

## 2020-01-21 MED ORDER — ALBUTEROL SULFATE (2.5 MG/3ML) 0.083% IN NEBU
5.0000 mg | INHALATION_SOLUTION | Freq: Once | RESPIRATORY_TRACT | Status: AC
  Administered 2020-01-21: 5 mg via RESPIRATORY_TRACT
  Filled 2020-01-21: qty 6

## 2020-01-21 MED ORDER — IPRATROPIUM-ALBUTEROL 0.5-2.5 (3) MG/3ML IN SOLN: 3.0000 mL | RESPIRATORY_TRACT | Status: DC | PRN

## 2020-01-21 MED ORDER — ONDANSETRON HCL 4 MG/2ML IJ SOLN: 4.0000 mg | Freq: Four times a day (QID) | INTRAMUSCULAR | Status: DC | PRN

## 2020-01-21 MED ORDER — ACETAMINOPHEN 325 MG PO TABS: 650.0000 mg | ORAL_TABLET | ORAL | Status: DC | PRN

## 2020-01-21 MED ORDER — POLYETHYLENE GLYCOL 3350 17 G PO PACK: 17.0000 g | PACK | Freq: Every day | ORAL | Status: DC | PRN

## 2020-01-21 MED ORDER — LEVOTHYROXINE SODIUM 100 MCG PO TABS
100.0000 ug | ORAL_TABLET | Freq: Every day | ORAL | Status: DC
  Administered 2020-01-22: 100 ug via ORAL
  Filled 2020-01-21: qty 1

## 2020-01-21 MED ORDER — METOPROLOL SUCCINATE ER 50 MG PO TB24
100.0000 mg | ORAL_TABLET | Freq: Two times a day (BID) | ORAL | Status: DC
  Administered 2020-01-21 – 2020-01-22 (×2): 100 mg via ORAL
  Filled 2020-01-21 (×2): qty 2

## 2020-01-21 MED ORDER — CALCIUM GLUCONATE-NACL 1-0.675 GM/50ML-% IV SOLN
1.0000 g | Freq: Once | INTRAVENOUS | Status: AC
  Administered 2020-01-21: 1000 mg via INTRAVENOUS
  Filled 2020-01-21: qty 50

## 2020-01-21 MED ORDER — LACTATED RINGERS IV BOLUS
1000.0000 mL | Freq: Once | INTRAVENOUS | Status: AC
  Administered 2020-01-21: 1000 mL via INTRAVENOUS

## 2020-01-21 MED ORDER — SODIUM CHLORIDE 0.9 % IV SOLN: 1.0000 g | Freq: Once | INTRAVENOUS | Status: DC

## 2020-01-21 MED ORDER — IOHEXOL 350 MG/ML SOLN
60.0000 mL | Freq: Once | INTRAVENOUS | Status: AC | PRN
  Administered 2020-01-21: 60 mL via INTRAVENOUS

## 2020-01-21 MED ORDER — INSULIN ASPART 100 UNIT/ML ~~LOC~~ SOLN
10.0000 [IU] | Freq: Once | SUBCUTANEOUS | Status: AC
  Administered 2020-01-21: 10 [IU] via INTRAVENOUS
  Filled 2020-01-21: qty 1

## 2020-01-21 MED ORDER — TRAZODONE HCL 50 MG PO TABS
50.0000 mg | ORAL_TABLET | Freq: Every day | ORAL | Status: DC
  Administered 2020-01-21: 50 mg via ORAL
  Filled 2020-01-21: qty 1

## 2020-01-21 MED ORDER — CHLORHEXIDINE GLUCONATE CLOTH 2 % EX PADS
6.0000 | MEDICATED_PAD | Freq: Every day | CUTANEOUS | Status: DC
  Administered 2020-01-21: 6 via TOPICAL

## 2020-01-21 NOTE — ED Triage Notes (Signed)
Pt arrived via [facility] from [facility] assisted living. Staff reports when they entered room pt was unresponsive with initial SpO2 at 60%, ems reports pt became more alert and SpO2 85% en route to ED. Pt put on 3L O2 en route and pt SpO2 increased to 90-95%

## 2020-01-21 NOTE — ED Notes (Signed)
Pt is alerted and oriented to time, place, and person however disoriented to situation. Pt responds to voice and follows commands.

## 2020-01-21 NOTE — Progress Notes (Signed)
TOC consulted to obtain Advance Directives. Please consult Chaplain if/when appropriate to complete Advance Directive Paperwork.  Per chart, daughter is next of kin.   CSW will continue to follow for needs.   Alfonso Ramus, [place] [phone]

## 2020-01-21 NOTE — ED Provider Notes (Signed)
[facility] Emergency Department Provider Note  ____________________________________________  Time seen: Approximately 1:44 AM  I have reviewed the triage vital signs and the nursing notes.   HISTORY  Chief Complaint Altered Mental Status  Level 5 caveat:  Portions of the history and physical were unable to be obtained due to dementia   HPI April Barrett is a 86 y.o. female with a history of CHF with EF 50% (11/2019), paroxysmal atrial fibrillation not on anticoagulation, CAD, GERD, hypertension, hyperlipidemia, pulmonary hypertension who presents for altered mental status.  According to EMS and history from nursing home patient was calling and asking for help to go to the bathroom.  When they walked in the room patient had her eyes open and it was looking at them but was not responding.  They then check her pulse oximeter and reports that her sats were in the 50s.  When EMS arrived patient's mental status was back to baseline and she was satting 85%.  She was started on 3 L of oxygen.  Patient denies chest pain or shortness of breath, abdominal pain.  She has bilateral leg swelling which she states is chronic.  She denies headache, cough or fever.  Past Medical History:  Diagnosis Date  . Aortic stenosis   . Carotid artery disease (HCC)   . Chronic diastolic heart failure (HCC)   . Coronary atherosclerosis of native coronary artery   . Essential hypertension, benign   . Gastroesophageal reflux disease   . History of kidney stones   . Hyperlipidemia   . Hypothyroidism   . Paroxysmal atrial fibrillation (HCC)   . Scoliosis     Patient Active Problem List   Diagnosis Date Noted  . Malnutrition of mild degree (HCC) 08/09/2019  . Mood disorder (HCC) 08/09/2019  . Paroxysmal atrial fibrillation (HCC)   . Aortic stenosis   . Chronic diastolic heart failure (HCC)   . Gastroesophageal reflux disease   . Carotid artery disease (HCC)   . Hypothyroidism    . Hyperlipidemia   . Scoliosis 01/20/2016  . Cardiomyopathy (HCC) 10/16/2014  . Pulmonary hypertension (HCC) 10/16/2014  . Arteriosclerosis of coronary artery 02/20/2014  . Essential (primary) hypertension 01/31/2014    Past Surgical History:  Procedure Laterality Date  . CATARACT EXTRACTION    . Childbirth  [phone]  . TTA      Prior to Admission medications   Medication Sig Start Date End Date Taking? Authorizing Provider  acetaminophen (TYLENOL) 325 MG tablet Take 650 mg by mouth 2 (two) times daily. For Rib Pain   Yes [provider]  aspirin EC 81 MG tablet Take 81 mg by mouth daily.    Yes [provider]  calcium carbonate (TUMS - DOSED IN MG ELEMENTAL CALCIUM) 500 MG chewable tablet Chew 2 tablets by mouth daily.    Yes [provider]  Cholecalciferol (VITAMIN D-1000 MAX ST) 1000 units tablet Take 1,000 Units by mouth daily.    Yes [provider]  Cyanocobalamin (RA VITAMIN B-12 TR) 1000 MCG TBCR Take 1,000 mcg by mouth daily.    Yes [provider]  diltiazem (TIAZAC) 180 MG 24 hr capsule Take 1 capsule (180 mg total) by mouth daily. 11/19/19  Yes Karie Schwalbe, MD  escitalopram (LEXAPRO) 5 MG tablet TAKE ONE TABLET BY MOUTH EVERY MORNING 01/11/20  Yes Karie Schwalbe, MD  furosemide (LASIX) 40 MG tablet Take 2 tablets (80 mg total) by mouth daily. 01/17/20  Yes Tillman Abide  I, MD  levothyroxine (SYNTHROID) 100 MCG tablet Take 1 tablet (100 mcg total) by mouth daily. 11/19/19  Yes Karie SchwalbeLetvak, Richard I, MD  metoprolol succinate (TOPROL-XL) 100 MG 24 hr tablet Take 1 tablet (100 mg total) by mouth 2 (two) times daily. 11/19/19  Yes Karie SchwalbeLetvak, Richard I, MD  potassium chloride (KLOR-CON 10) 10 MEQ tablet Take 1 tablet (10 mEq total) by mouth 2 (two) times daily. 11/19/19  Yes Karie SchwalbeLetvak, Richard I, MD  sennosides-docusate sodium (SENOKOT-S) 8.6-50 MG tablet Take 2 tablets by mouth daily.   Yes [provider]  traZODone  (DESYREL) 50 MG tablet Take 1 tablet (50 mg total) by mouth at bedtime. 11/19/19  Yes Karie SchwalbeLetvak, Richard I, MD    Allergies Ace inhibitors, Amlodipine, and Statins  Family History  Problem Relation Age of Onset  . Hypertension Mother   . AAA (abdominal aortic aneurysm) Mother   . Stroke Mother   . Alzheimer's disease Father   . Prostate cancer Father   . Asthma Sister   . Hypertension Maternal Grandmother   . Colon cancer Maternal Grandmother   . Breast cancer Paternal Aunt     Social History Social History   Tobacco Use  . Smoking status: Unknown If Ever Smoked  . Smokeless tobacco: Never Used  Substance Use Topics  . Alcohol use: No    Alcohol/week: 0.0 standard drinks  . Drug use: Not on file    Review of Systems  Constitutional: Negative for fever. + AMS Eyes: Negative for visual changes. ENT: Negative for sore throat. Neck: No neck pain  Cardiovascular: Negative for chest pain. Respiratory: Negative for shortness of breath. Gastrointestinal: Negative for abdominal pain, vomiting or diarrhea. Genitourinary: Negative for dysuria. Musculoskeletal: Negative for back pain. Skin: Negative for rash. Neurological: Negative for headaches, weakness or numbness. Psych: No SI or HI  ____________________________________________   PHYSICAL EXAM:  VITAL SIGNS: ED Triage Vitals  Enc Vitals Group     BP 01/15/2020 0129 (!) 129/94     Pulse Rate 01/17/2020 0129 98     Resp 01/14/2020 0129 (!) 29     Temp --      Temp src --      SpO2 --      Weight 01/12/2020 0124 110 lb 3.2 oz (50 kg)     Height 01/25/2020 0124 5\' 3"  (1.6 m)     Head Circumference --      Peak Flow --      Pain Score 01/20/2020 0124 0     Pain Loc --      Pain Edu? --      Excl. in GC? --     Constitutional: Alert and oriented to self only, mild respiratory distress.  HEENT:      Head: Normocephalic and atraumatic.         Eyes: Conjunctivae are normal. Sclera is non-icteric.       Mouth/Throat: Mucous  membranes are moist.       Neck: Supple with no signs of meningismus. Cardiovascular: Regular rate and rhythm. No murmurs, gallops, or rubs. 2+ symmetrical distal pulses are present in all extremities. Elevated JVD. Respiratory: Increased WOB, tachypneic, hypoxic to the low 80s on room air, auscultation of the lungs showing no wheezing or crackles with good air movement  Gastrointestinal: Soft, non tender, and non distended Musculoskeletal: 2+ pitting edema bilaterally Neurologic: Normal speech and language. Face is symmetric. Moving all extremities. No gross focal neurologic deficits are appreciated. Skin: Skin is warm, dry  and intact. No rash noted. Psychiatric: Mood and affect are normal. Speech and behavior are normal.  ____________________________________________   LABS (all labs ordered are listed, but only abnormal results are displayed)  Labs Reviewed  CBC WITH DIFFERENTIAL/PLATELET - Abnormal; Notable for the following components:      Result Value   WBC 12.4 (*)    Neutro Abs 10.1 (*)    Monocytes Absolute 1.4 (*)    Abs Immature Granulocytes 0.13 (*)    All other components within normal limits  COMPREHENSIVE METABOLIC PANEL - Abnormal; Notable for the following components:   Sodium 124 (*)    Potassium 5.8 (*)    Chloride 91 (*)    CO2 19 (*)    Glucose, Bld 288 (*)    BUN 46 (*)    Creatinine, Ser 1.79 (*)    Total Protein 6.4 (*)    Albumin 3.3 (*)    AST 195 (*)    ALT 274 (*)    Alkaline Phosphatase 139 (*)    GFR calc non Af Amer 25 (*)    GFR calc Af Amer 29 (*)    All other components within normal limits  BRAIN NATRIURETIC PEPTIDE - Abnormal; Notable for the following components:   B Natriuretic Peptide 2,573.0 (*)    All other components within normal limits  TROPONIN I (HIGH SENSITIVITY) - Abnormal; Notable for the following components:   Troponin I (High Sensitivity) 575 (*)    All other components within normal limits  RESPIRATORY PANEL BY RT PCR  (FLU A&B, COVID)  TROPONIN I (HIGH SENSITIVITY)   ____________________________________________  EKG  ED ECG REPORT I, Nita Sickle, the attending physician, personally viewed and interpreted this ECG.  Atrial fibrillation, rate of 113, right bundle branch block, LPF B, prolonged QTC, no ST elevations or depressions.  New when compared to prior. ____________________________________________  RADIOLOGY  I have personally reviewed the images performed during this visit and I agree with the Radiologist's read.   Interpretation by Radiologist:  CT Angio Chest PE W and/or Wo Contrast  Result Date: 01/06/2020 CLINICAL DATA:  Hypoxia EXAM: CT ANGIOGRAPHY CHEST WITH CONTRAST TECHNIQUE: Multidetector CT imaging of the chest was performed using the standard protocol during bolus administration of intravenous contrast. Multiplanar CT image reconstructions and MIPs were obtained to evaluate the vascular anatomy. CONTRAST:  39mL OMNIPAQUE IOHEXOL 350 MG/ML SOLN COMPARISON:  02/17/2014 FINDINGS: Cardiovascular: Cardiomegaly. No pericardial effusion. Limited pulmonary artery opacification due to early bolus and respiratory motion. No central/lobar embolism is seen when accounting for mixing artifact at the levels of the left and right pulmonary arteries. There is renal failure such that automatic repeat was not performed. Heavily calcified aorta without dilatation or visible intramural hematoma. Coronary atherosclerosis. Mediastinum/Nodes: No adenopathy or pneumomediastinum. Lungs/Pleura: Mild streaky opacity most consistent with atelectasis. Trace pleural effusions. No pulmonary edema or consolidation. Upper Abdomen: Extensive atherosclerotic calcification. Musculoskeletal: Severe spinal degeneration and scoliosis. Remote appearing T3 superior endplate and T5 compression fractures. Remote left posterior rib fractures. Review of the MIP images confirms the above findings. IMPRESSION: 1. Very limited PE  study, no embolism seen to the level of the main pulmonary arteries. 2. Cardiomegaly without failure. 3.  Aortic Atherosclerosis (ICD10-I70.0) that is extensive. 4. Nonacute T3 and T5 compression fractures. Electronically Signed   By: Marnee Spring M.D.   On: 01/04/2020 04:18   DG Chest Portable 1 View  Result Date: 01/17/2020 CLINICAL DATA:  Hypoxia. EXAM: PORTABLE CHEST 1 VIEW COMPARISON:  Feb 15, 2014 FINDINGS: Decreased lung volumes are seen which is likely, in part secondary to suboptimal patient inspiration. Mild, stable chronic appearing increased lung markings are noted bilaterally. A small left pleural effusion is seen. No pneumothorax is identified. The cardiac silhouette is moderately enlarged and unchanged in size. There is marked severity calcification of the thoracic aorta. Multilevel degenerative changes seen throughout the thoracic spine. IMPRESSION: Chronic appearing increased lung markings with a small left pleural effusion. Electronically Signed   By: Virgina Norfolk M.D.   On: 12/28/2019 02:07     ____________________________________________   PROCEDURES  Procedure(s) performed:yes  .1-3 Lead EKG Interpretation Performed by: Rudene Re, MD Authorized by: Rudene Re, MD     Interpretation: abnormal     ECG rate assessment: tachycardic     Rhythm: atrial fibrillation     Ectopy: none     Conduction: normal     Critical Care performed: yes  CRITICAL CARE Performed by: Rudene Re  ?  Total critical care time: 45 min  Critical care time was exclusive of separately billable procedures and treating other patients.  Critical care was necessary to treat or prevent imminent or life-threatening deterioration.  Critical care was time spent personally by me on the following activities: development of treatment plan with patient and/or surrogate as well as nursing, discussions with consultants, evaluation of patient's response to treatment,  examination of patient, obtaining history from patient or surrogate, ordering and performing treatments and interventions, ordering and review of laboratory studies, ordering and review of radiographic studies, pulse oximetry and re-evaluation of patient's condition.  ____________________________________________   INITIAL IMPRESSION / ASSESSMENT AND PLAN / ED COURSE  86 y.o. female with a history of CHF with EF 50% (11/2019), paroxysmal atrial fibrillation not on anticoagulation, CAD, GERD, hypertension, hyperlipidemia, pulmonary hypertension who presents for brief episode of altered mental status and hypoxia.  Patient arrives with increased work of breathing, tachypneic, satting in the mid 80s on room air requiring 3L [place].  She looks grossly volume overloaded with elevated JVD and 2+ pitting edema however lung auscultation shows no crackles and good air movement.  History is limited due to history of dementia however per nursing home patient seems to be at baseline.  Differential diagnosis including CHF exacerbation versus pneumonia versus Covid versus flu versus pulmonary hypertension versus PE.  EKG showing new right bundle branch block and atrial fibrillation with ventricular rate of 113. Labs and CXR pending. Covid and flu swab pending.      _________________________ 4:29 AM on 01/07/2020 -----------------------------------------  Negative Covid and flu.  Labs show leukocytosis with white count of 12.4 with a left shift, no anemia, hyponatremia, hyperkalemia, hyperglycemia, acute kidney injury, new RBBB/LPFB.  CT with no PE or pneumonia, no overt pulmonary edema but did show cardiomegaly.  Patient remains on 4 L nasal cannula.  Daughter is at bedside and provided further history.  She confirms DNR/DNI.  Patient was treated with calcium, albuterol, glucose and D50, and Lasix for hyperkalemia.  She was given 80 mg of IV Lasix for CHF.  Troponin is elevated at 575 with a BNP of 2500.  At this  time I am a little hesitant of putting patient on heparin since she continues to deny any chest pain and the fact the patient had a GI bleed last year which prompted her doctor to take her off of blood thinners.  I will wait for the result of the second troponin and if that is trending up we will discussed  risks and benefits with the daughter.  Spoke with Dr. Arville Care who recommended consulting ICU since he felt patient was too sick for his service.  I spoke with Jeri Modena ICU NP who accepted patient to his service.  _____________________________________________ Please note:  Patient was evaluated in Emergency Department today for the symptoms described in the history of present illness. Patient was evaluated in the context of the global COVID-19 pandemic, which necessitated consideration that the patient might be at risk for infection with the SARS-CoV-2 virus that causes COVID-19. Institutional protocols and algorithms that pertain to the evaluation of patients at risk for COVID-19 are in a state of rapid change based on information released by regulatory bodies including the CDC and federal and state organizations. These policies and algorithms were followed during the patient's care in the ED.  Some ED evaluations and interventions may be delayed as a result of limited staffing during the pandemic.    Controlled Substance Database was reviewed by me. ____________________________________________   FINAL CLINICAL IMPRESSION(S) / ED DIAGNOSES   Final diagnoses:  Acute respiratory failure with hypoxia (HCC)  Acute on chronic congestive heart failure, unspecified heart failure type (HCC)  Atrial fibrillation with RVR (HCC)  Hyponatremia  Hyperkalemia  AKI (acute kidney injury) (HCC)  NSTEMI (non-ST elevated myocardial infarction) (HCC)      NEW MEDICATIONS STARTED DURING THIS VISIT:  ED Discharge Orders    None       Note:  This document was prepared using Dragon voice recognition  software and may include unintentional dictation errors.    Don Perking, [place], MD 01/31/2020 [phone]

## 2020-01-21 NOTE — ED Notes (Signed)
Pt transported to US

## 2020-01-21 NOTE — Progress Notes (Addendum)
  PROGRESS NOTE    April Barrett  VZS:[id number] DOB: [birth year] DOA: 2020/02/14  PCP: [facility]    LOS - 0    86 y.o. female past medical history significant for DNR/DNI status, CHF (EF 50% on 11/2019), paroxysmal atrial fibrillation not on anticoagulation due to previous GI bleed, CAD, GERD, hypertension, hyperlipidemia, pulmonary hypertension who presents to [facility] ED on Feb 14, 2020 from [facility] nursing facility due to altered mental status.    Admitted to stepdown early hours of Feb 14, 2020 with acute respiratory failure with hypoxia secondary to acute on chronic systolic CHF, elevated troponin in setting of demand ischemia vs NSTEMI, and AKI with Hyperkalemia.  [facility] Cardiology consulted.  PCCM admitted patient to stepdown initially.  Patient clinically improved and hemodynamically stable, has not required bipap but could potentially require it at nighttime.  Currently requiring 3-4 L/min nasal cannula oxygen.  TR[facility] to assume care of patient tomorrow 4/27.  --Appreciate cardiology consult, recommendations pending at this time --Continue diuresis  --follow up Echo --RN to obtain ReDS clip reading --add of Mg level to today's labs --maintain Mg>2.0, K>4.0 --transfer out to 2A [facility]      No Charge    Pennie Banter, DO [facility]   If 7PM-7AM, please contact night-coverage [email or web address] 02/14/20, 3:50 PM

## 2020-01-21 NOTE — H&P (Signed)
Name: April Barrett MRN: [id number] DOB: November 24, 1927    ADMISSION DATE:  01/17/2020 CONSULTATION DATE:  01/05/2020  REFERRING MD :  Dr. Don Perking  CHIEF COMPLAINT:  Altered Mental Status, Hypoxia  BRIEF PATIENT DESCRIPTION:  86 y.o. Female, DNR/DNI, admitted 12/27/2019 with Acute Hypoxic Respiratory Failure secondary to Acute on Chronic CHF, elevated troponin in setting of demand ischemia vs NSTEMI, and AKI with Hyperkalemia.  Cardiology consulted.  SIGNIFICANT EVENTS  4/26: Admission to Stepdown  STUDIES:  4/26: CXR>>Chronic appearing increased lung markings with a small left pleural Effusion. 4/26: CTA Chest>>1. Very limited PE study, no embolism seen to the level of the main pulmonary arteries. 2. Cardiomegaly without failure. 3.  Aortic Atherosclerosis (ICD10-I70.0) that is extensive. 4. Nonacute T3 and T5 compression fractures. 4/26: 2D Echocardiogram>> 4/26: Renal US>> 4/26: RUQ Abdominal US>>  CULTURES: SARS-CoV-2 PCR 4/26>> Negative Influenza PCR 4/26>> negative  ANTIBIOTICS: N/A  HISTORY OF PRESENT ILLNESS:   April Barrett is a 86 year old female, DNR/DNI, with a past medical history significant for CHF (EF 50% on 11/2019), paroxysmal atrial fibrillation not on anticoagulation due to previous GI bleed, CAD, GERD, hypertension, hyperlipidemia, pulmonary hypertension who presents to [facility] ED on 12/30/2019 from [facility] nursing facility due to altered mental status.  Per the staff at the facility the patient was calling and asking for help to go to the bathroom, and when staff walked in the room the patient's eyes were open but she was not responding to them, and she was found to be hypoxic with O2 sats in the 50s.  Upon EMS arrival her mental status was back to baseline and her O2 saturations had improved to 85%.  She was placed on 3 L nasal cannula.  She denies chest pain, shortness of breath, cough, sputum production, fever, chills, abdominal pain, nausea, vomiting,  diarrhea dysuria.  Upon presentation to the ED she was noted to have increased work of breathing, tachypnea with O2 saturations in the mid 80s on room air.  On physical exam she appears grossly volume overloaded with elevated JVD and 2+ pitting edema to the bilateral lower extremities.  Initial work-up in the ED revealed BNP 2573, high-sensitivity troponin 575, WBC 12.4 with neutrophilia, sodium 124, potassium 5.8, bicarb 19, BUN 46, creatinine 1.79, glucose 288, alkaline phosphatase 139, AST 195, ALT 274.  CTA chest negative for PE or pneumonia, does show cardiomegaly without overt pulmonary edema.  EKG with atrial fibrillation and new RBBB/LPFB.  She was given calcium gluconate, albuterol, insulin and D50, and Lasix for hyperkalemia.  Given her history of GI bleed, has been taken off all anticoagulation by her primary care physician) heparin drip has not been initiated at this time.  PCCM is consulted to admit the patient to stepdown unit for further work-up and treatment of acute hypoxic respiratory failure secondary to acute on chronic CHF, elevated troponin in the setting of demand ischemia versus NSTEMI, AKI, hyperkalemia, and hyponatremia.  Cardiology has been consulted.  Updated patient's daughter at bedside, and she confirms her mother is DNR/DNI.  PAST MEDICAL HISTORY :   has a past medical history of Aortic stenosis, Carotid artery disease (HCC), Chronic diastolic heart failure (HCC), Coronary atherosclerosis of native coronary artery, Essential hypertension, benign, Gastroesophageal reflux disease, History of kidney stones, Hyperlipidemia, Hypothyroidism, Paroxysmal atrial fibrillation (HCC), and Scoliosis.  has a past surgical history that includes Cataract extraction; TTA; and Childbirth [id number]). Prior to Admission medications   Medication Sig Start Date End Date Taking? Authorizing Provider  acetaminophen (  TYLENOL) 325 MG tablet Take 650 mg by mouth 2 (two) times daily. For Rib  Pain   Yes [provider]  aspirin EC 81 MG tablet Take 81 mg by mouth daily.    Yes [provider]  calcium carbonate (TUMS - DOSED IN MG ELEMENTAL CALCIUM) 500 MG chewable tablet Chew 2 tablets by mouth daily.    Yes [provider]  Cholecalciferol (VITAMIN D-1000 MAX ST) 1000 units tablet Take 1,000 Units by mouth daily.    Yes [provider]  Cyanocobalamin (RA VITAMIN B-12 TR) 1000 MCG TBCR Take 1,000 mcg by mouth daily.    Yes [provider]  diltiazem (TIAZAC) 180 MG 24 hr capsule Take 1 capsule (180 mg total) by mouth daily. 11/19/19  Yes Karie Schwalbe, MD  escitalopram (LEXAPRO) 5 MG tablet TAKE ONE TABLET BY MOUTH EVERY MORNING 01/11/20  Yes Karie Schwalbe, MD  furosemide (LASIX) 40 MG tablet Take 2 tablets (80 mg total) by mouth daily. 01/17/20  Yes Karie Schwalbe, MD  levothyroxine (SYNTHROID) 100 MCG tablet Take 1 tablet (100 mcg total) by mouth daily. 11/19/19  Yes Karie Schwalbe, MD  metoprolol succinate (TOPROL-XL) 100 MG 24 hr tablet Take 1 tablet (100 mg total) by mouth 2 (two) times daily. 11/19/19  Yes Karie Schwalbe, MD  potassium chloride (KLOR-CON 10) 10 MEQ tablet Take 1 tablet (10 mEq total) by mouth 2 (two) times daily. 11/19/19  Yes Karie Schwalbe, MD  sennosides-docusate sodium (SENOKOT-S) 8.6-50 MG tablet Take 2 tablets by mouth daily.   Yes [provider]  traZODone (DESYREL) 50 MG tablet Take 1 tablet (50 mg total) by mouth at bedtime. 11/19/19  Yes Karie Schwalbe, MD   Allergies  Allergen Reactions  . Ace Inhibitors Cough  . Amlodipine Swelling  . Statins Other (See Comments)    FAMILY HISTORY:  family history includes AAA (abdominal aortic aneurysm) in her mother; Alzheimer's disease in her father; Asthma in her sister; Breast cancer in her paternal aunt; Colon cancer in her maternal grandmother; Hypertension in her maternal grandmother and mother; Prostate cancer in her father; Stroke in  her mother. SOCIAL HISTORY:  has an unknown smoking status. She has never used smokeless tobacco. She reports that she does not drink alcohol.   COVID-19 DISASTER DECLARATION:  FULL CONTACT PHYSICAL EXAMINATION WAS NOT POSSIBLE DUE TO TREATMENT OF COVID-19 AND  CONSERVATION OF PERSONAL PROTECTIVE EQUIPMENT, LIMITED EXAM FINDINGS INCLUDE-  Patient assessed or the symptoms described in the history of present illness.  In the context of the Global COVID-19 pandemic, which necessitated consideration that the patient might be at risk for infection with the SARS-CoV-2 virus that causes COVID-19, Institutional protocols and algorithms that pertain to the evaluation of patients at risk for COVID-19 are in a state of rapid change based on information released by regulatory bodies including the CDC and federal and state organizations. These policies and algorithms were followed during the patient's care while in hospital.  REVIEW OF SYSTEMS:  Positives in BOLD Constitutional: Negative for fever, chills, weight loss, malaise/fatigue and diaphoresis.  HENT: Negative for hearing loss, ear pain, nosebleeds, congestion, sore throat, neck pain, tinnitus and ear discharge.   Eyes: Negative for blurred vision, double vision, photophobia, pain, discharge and redness.  Respiratory: Negative for cough, hemoptysis, sputum production, shortness of breath, wheezing and stridor.   Cardiovascular: Negative for chest pain, palpitations, orthopnea, claudication, + bilateral leg swelling and PND.  Gastrointestinal: Negative for heartburn,  nausea, vomiting, abdominal pain, diarrhea, constipation, blood in stool and melena.  Genitourinary: Negative for dysuria, urgency, frequency, hematuria and flank pain.  Musculoskeletal: Negative for myalgias, back pain, joint pain and falls.  Skin: Negative for itching and rash.  Neurological: Negative for dizziness, tingling, tremors, sensory change, speech change, focal weakness,  seizures, loss of consciousness, weakness and headaches.  Endo/Heme/Allergies: Negative for environmental allergies and polydipsia. Does not bruise/bleed easily.  SUBJECTIVE:  Pt denies chest pain, palpitations, SOB, cough, fever/chills, abdominal pain, N/V/D, dysuria Reports bilateral leg swelling Says "I need to use the bathroom" On 4L [place]  VITAL SIGNS: Temp:  [96.6 F (35.9 C)] 96.6 F (35.9 C) (04/26 0206) Pulse Rate:  [35-109] 102 (04/26 0230) Resp:  [23-29] 25 (04/26 0230) BP: (114-129)/(86-94) 114/86 (04/26 0230) SpO2:  [85 %-99 %] 97 % (04/26 0230) Weight:  [50 kg] 50 kg (04/26 0124)  PHYSICAL EXAMINATION: General: Acutely on chronically ill-appearing female, laying in bed, on 4 L nasal cannula, no acute distress Neuro: Sleeping, arouses to voice, oriented to self only, follows commands, no focal deficits, speech clear HEENT: Atraumatic, normocephalic, neck supple, positive JVD, pupils PERRLA Cardiovascular: Tachycardia, irregularly irregular rhythm, no murmurs, rubs, gallops, 2+ pulses throughout Lungs: Coarse breath sounds bilaterally, no wheezing, even, nonlabored Abdomen: Soft, nontender, nondistended, no guarding or rebound tenderness, bowel sounds positive x4 Musculoskeletal: No deformities, 2+ pitting edema bilateral lower extremities Skin: Warm and dry.  No obvious rashes, lesions, or ulcerations  Recent Labs  Lab 01/04/2020 0128  NA 124*  K 5.8*  CL 91*  CO2 19*  BUN 46*  CREATININE 1.79*  GLUCOSE 288*   Recent Labs  Lab 01/05/2020 0128  HGB 12.5  HCT 37.9  WBC 12.4*  PLT 194   CT Angio Chest PE W and/or Wo Contrast  Result Date: 12/27/2019 CLINICAL DATA:  Hypoxia EXAM: CT ANGIOGRAPHY CHEST WITH CONTRAST TECHNIQUE: Multidetector CT imaging of the chest was performed using the standard protocol during bolus administration of intravenous contrast. Multiplanar CT image reconstructions and MIPs were obtained to evaluate the vascular anatomy. CONTRAST:  30mL  OMNIPAQUE IOHEXOL 350 MG/ML SOLN COMPARISON:  02/17/2014 FINDINGS: Cardiovascular: Cardiomegaly. No pericardial effusion. Limited pulmonary artery opacification due to early bolus and respiratory motion. No central/lobar embolism is seen when accounting for mixing artifact at the levels of the left and right pulmonary arteries. There is renal failure such that automatic repeat was not performed. Heavily calcified aorta without dilatation or visible intramural hematoma. Coronary atherosclerosis. Mediastinum/Nodes: No adenopathy or pneumomediastinum. Lungs/Pleura: Mild streaky opacity most consistent with atelectasis. Trace pleural effusions. No pulmonary edema or consolidation. Upper Abdomen: Extensive atherosclerotic calcification. Musculoskeletal: Severe spinal degeneration and scoliosis. Remote appearing T3 superior endplate and T5 compression fractures. Remote left posterior rib fractures. Review of the MIP images confirms the above findings. IMPRESSION: 1. Very limited PE study, no embolism seen to the level of the main pulmonary arteries. 2. Cardiomegaly without failure. 3.  Aortic Atherosclerosis (ICD10-I70.0) that is extensive. 4. Nonacute T3 and T5 compression fractures. Electronically Signed   By: Monte Fantasia M.D.   On: 01/14/2020 04:18   DG Chest Portable 1 View  Result Date: 01/15/2020 CLINICAL DATA:  Hypoxia. EXAM: PORTABLE CHEST 1 VIEW COMPARISON:  Feb 15, 2014 FINDINGS: Decreased lung volumes are seen which is likely, in part secondary to suboptimal patient inspiration. Mild, stable chronic appearing increased lung markings are noted bilaterally. A small left pleural effusion is seen. No pneumothorax is identified. The cardiac silhouette is moderately enlarged and  unchanged in size. There is marked severity calcification of the thoracic aorta. Multilevel degenerative changes seen throughout the thoracic spine. IMPRESSION: Chronic appearing increased lung markings with a small left pleural  effusion. Electronically Signed   By: Aram Candela M.D.   On: 2020/02/10 02:07    ASSESSMENT / PLAN:  Acute Hypoxic Respiratory Failure secondary to Acute Decompensated CHF -Supplemental O2 as needed to maintain O2 saturations greater than 92% -Patient is DNR/DNI -Follow intermittent chest x-ray and ABG as needed -Received 80 mg IV Lasix in ED x1 dose -Further diuresis as renal function and blood pressure permits  Acute on Chronic CHF Elevated troponin,  in setting of NSTEMI vs. Demand ischemia Atrial Fibrillation, rate currently controlled Hx: A-fib on no anticoagulation -Continuous cardiac monitoring -Maintain MAP greater than 65 -Received 80 mg IV Lasix in the ED x1 dose -Further diuresis as renal function and blood pressure permits -Trend BNP -Trend troponin until downtrending -Cardiology consulted, appreciate input -Currently holding off on heparin infusion due to GI bleed within the last year, will continue to trend troponin and if significant increase will then start heparin drip -2D echocardiogram pending  AKI Hyperkalemia Hyponatremia, likely hypotonic hypervolemic in setting of CHF  -Monitor I&O's / urinary output -Follow BMP -Ensure adequate renal perfusion  -Avoid nephrotoxic agents as able -Replace electrolytes as indicated -Received albuterol, calcium gluconate, insulin, & Lasix for Hyperkalemia -Follow-up potassium @ 10:00 today  Mild Leukocytosis w/ Neutrophilia  -Monitor fever curve -Trend WBCs -Check procalcitonin -Follow cultures as above -Chest x-ray/CTA Chest without infiltrate -Urinalysis is currently pending -No abdominal pain/nausea/diarrhea -No obvious wounds or signs of cellulitis  Elevated LFT's -Trend LFT's -Consider Abdominal US  Hyperglycemia Hypothyroidism -CBGs -Sliding scale insulin -Follow ICU hypo-hyperglycemia protocol -Continue home Synthroid -Check TSH  Altered Mental Status>>resolved Hx: Dementia -Provide  supportive care     BEST PRACTICES: DISPOSITION: Stepdown GOALS OF CARE: DNR/DNI (confirmed with pt's daughter at bedside) VTE PROPHYLAXIS: SCD's (no chemical px due to history of GI bleed) CONSULTS: Cardiology UPDATES: Updated pt's daughter at bedside 10-Feb-2020  Harlon Ditty, AGACNP-BC [facility] Pager: [phone]  2020/02/10, 5:16 AM

## 2020-01-21 NOTE — TOC Initial Note (Signed)
Transition of Care [facility]) - Initial/Assessment Note    Patient Details  Name: April Barrett MRN: [id number] Date of Birth: [birth year]  Transition of Care [facility]) CM/SW Contact:    Tiburon Cellar, RN Phone Number: 01/14/2020, 12:37 PM  Clinical Narrative:                 Patient is from [facility]. Daughter is at bedside and visibly concerned about patient. Daughter was able to confirm DNR/DNI but requested RN CM come back after patient more stable.         Patient Goals and CMS Choice        Expected Discharge Plan and Services                                                Prior Living Arrangements/Services                       Activities of Daily Living      Permission Sought/Granted                  Emotional Assessment              Admission diagnosis:  CHF (congestive heart failure) (HCC) [I50.9] Patient Active Problem List   Diagnosis Date Noted  . CHF (congestive heart failure) (HCC) 01/06/2020  . Malnutrition of mild degree (HCC) 08/09/2019  . Mood disorder (HCC) 08/09/2019  . Paroxysmal atrial fibrillation (HCC)   . Aortic stenosis   . Chronic diastolic heart failure (HCC)   . Gastroesophageal reflux disease   . Carotid artery disease (HCC)   . Hypothyroidism   . Hyperlipidemia   . Scoliosis 01/20/2016  . Cardiomyopathy (HCC) 10/16/2014  . Pulmonary hypertension (HCC) 10/16/2014  . Arteriosclerosis of coronary artery 02/20/2014  . Essential (primary) hypertension 01/31/2014   PCP:  [facility] Pharmacy:   [id number] [place], [place] - [address] [facility] [address] [place] [address] Phone: [phone] Fax: [phone]  [facility] - [place], [place] - [address] [place] [address] Phone: [phone] Fax: [phone]     Social Determinants of Health (SDOH) Interventions     Readmission Risk Interventions No flowsheet data found.

## 2020-01-21 NOTE — ED Notes (Signed)
This RN and Beth, NT attempted to get accurate reading of pt SpO2 on pt finger, nose, toe, and ear.  SpO2 reading continuously stops then resumes reading due to pt being cold, MD Don Perking made aware. When SpO2 is reading, it is between 97-100% on 3L of O2.

## 2020-01-22 ENCOUNTER — Inpatient Hospital Stay: Payer: Medicare Other

## 2020-01-22 ENCOUNTER — Inpatient Hospital Stay
Admit: 2020-01-22 | Discharge: 2020-01-22 | Disposition: A | Discharge status: Home / Self Care | Payer: Medicare Other | Attending: Pulmonary Disease | Admitting: Pulmonary Disease

## 2020-01-22 DIAGNOSIS — J9601 Acute respiratory failure with hypoxia: Secondary | ICD-10-CM | POA: Diagnosis not present

## 2020-01-22 DIAGNOSIS — I5021 Acute systolic (congestive) heart failure: Secondary | ICD-10-CM | POA: Diagnosis not present

## 2020-01-22 LAB — CBC
HCT: 37.5 % (ref 36.0–46.0)
Hemoglobin: 12.5 g/dL (ref 12.0–15.0)
MCH: 31.6 pg (ref 26.0–34.0)
MCHC: 33.3 g/dL (ref 30.0–36.0)
MCV: 94.9 fL (ref 80.0–100.0)
Platelets: 199 10*3/uL (ref 150–400)
RBC: 3.95 MIL/uL (ref 3.87–5.11)
RDW: 14 % (ref 11.5–15.5)
WBC: 14.8 10*3/uL — ABNORMAL HIGH (ref 4.0–10.5)
nRBC: 0 % (ref 0.0–0.2)

## 2020-01-22 LAB — BASIC METABOLIC PANEL
Anion gap: 13 (ref 5–15)
BUN: 55 mg/dL — ABNORMAL HIGH (ref 8–23)
CO2: 20 mmol/L — ABNORMAL LOW (ref 22–32)
Calcium: 9.6 mg/dL (ref 8.9–10.3)
Chloride: 97 mmol/L — ABNORMAL LOW (ref 98–111)
Creatinine, Ser: 1.5 mg/dL — ABNORMAL HIGH (ref 0.44–1.00)
GFR calc Af Amer: 35 mL/min — ABNORMAL LOW (ref >60)
GFR calc non Af Amer: 31 mL/min — ABNORMAL LOW (ref >60)
Glucose, Bld: 80 mg/dL (ref 70–99)
Potassium: 5.3 mmol/L — ABNORMAL HIGH (ref 3.5–5.1)
Sodium: 130 mmol/L — ABNORMAL LOW (ref 135–145)

## 2020-01-22 LAB — GLUCOSE, CAPILLARY
Glucose-Capillary: 146 mg/dL — ABNORMAL HIGH (ref 70–99)
Glucose-Capillary: 81 mg/dL (ref 70–99)
Glucose-Capillary: 88 mg/dL (ref 70–99)
Glucose-Capillary: 92 mg/dL (ref 70–99)

## 2020-01-22 LAB — BRAIN NATRIURETIC PEPTIDE: B Natriuretic Peptide: 2258 pg/mL — ABNORMAL HIGH (ref 0.0–100.0)

## 2020-01-22 LAB — PHOSPHORUS: Phosphorus: 4.9 mg/dL — ABNORMAL HIGH (ref 2.5–4.6)

## 2020-01-22 LAB — POTASSIUM: Potassium: 5.4 mmol/L — ABNORMAL HIGH (ref 3.5–5.1)

## 2020-01-22 LAB — MAGNESIUM: Magnesium: 2.4 mg/dL (ref 1.7–2.4)

## 2020-01-22 MED ORDER — MORPHINE SULFATE (PF) 2 MG/ML IV SOLN
2.0000 mg | INTRAVENOUS | Status: DC | PRN
  Administered 2020-01-22: 2 mg via INTRAVENOUS
  Filled 2020-01-22: qty 1

## 2020-01-22 MED ORDER — GLYCOPYRROLATE 0.2 MG/ML IJ SOLN: 0.2000 mg | INTRAMUSCULAR | Status: DC | PRN

## 2020-01-22 MED ORDER — AMIODARONE LOAD VIA INFUSION
150.0000 mg | Freq: Once | INTRAVENOUS | Status: AC
  Administered 2020-01-22: 150 mg via INTRAVENOUS
  Filled 2020-01-22: qty 83.34

## 2020-01-22 MED ORDER — SODIUM CHLORIDE 0.9 % IV BOLUS
250.0000 mL | Freq: Once | INTRAVENOUS | Status: AC
  Administered 2020-01-22: 250 mL via INTRAVENOUS

## 2020-01-22 MED ORDER — DILTIAZEM HCL ER COATED BEADS 180 MG PO CP24
180.0000 mg | ORAL_CAPSULE | Freq: Every day | ORAL | Status: DC
  Filled 2020-01-22: qty 1

## 2020-01-22 MED ORDER — DILTIAZEM HCL ER BEADS 180 MG PO CP24: 180.0000 mg | ORAL_CAPSULE | Freq: Every day | ORAL | Status: DC

## 2020-01-22 MED ORDER — DIPHENHYDRAMINE HCL 50 MG/ML IJ SOLN: 25.0000 mg | INTRAMUSCULAR | Status: DC | PRN

## 2020-01-22 MED ORDER — GLYCOPYRROLATE 1 MG PO TABS
1.0000 mg | ORAL_TABLET | ORAL | Status: DC | PRN
  Filled 2020-01-22: qty 1

## 2020-01-22 MED ORDER — FUROSEMIDE 10 MG/ML IJ SOLN
40.0000 mg | Freq: Once | INTRAMUSCULAR | Status: AC
  Administered 2020-01-22: 40 mg via INTRAVENOUS
  Filled 2020-01-22: qty 4

## 2020-01-22 MED ORDER — METOPROLOL TARTRATE 5 MG/5ML IV SOLN
2.5000 mg | Freq: Four times a day (QID) | INTRAVENOUS | Status: DC | PRN
  Administered 2020-01-22: 2.5 mg via INTRAVENOUS
  Filled 2020-01-22: qty 5

## 2020-01-22 MED ORDER — FUROSEMIDE 20 MG PO TABS: 80.0000 mg | ORAL_TABLET | Freq: Every day | ORAL | Status: DC

## 2020-01-22 MED ORDER — ACETAMINOPHEN 325 MG PO TABS: 650.0000 mg | ORAL_TABLET | Freq: Four times a day (QID) | ORAL | Status: DC | PRN

## 2020-01-22 MED ORDER — METOPROLOL TARTRATE 5 MG/5ML IV SOLN: 2.5000 mg | Freq: Four times a day (QID) | INTRAVENOUS | Status: DC | PRN

## 2020-01-22 MED ORDER — ACETAMINOPHEN 650 MG RE SUPP: 650.0000 mg | Freq: Four times a day (QID) | RECTAL | Status: DC | PRN

## 2020-01-22 MED ORDER — CHLORHEXIDINE GLUCONATE CLOTH 2 % EX PADS: 6.0000 | MEDICATED_PAD | Freq: Every day | CUTANEOUS | Status: DC

## 2020-01-22 MED ORDER — DILTIAZEM LOAD VIA INFUSION
10.0000 mg | Freq: Once | INTRAVENOUS | Status: DC
  Filled 2020-01-22: qty 10

## 2020-01-22 MED ORDER — POLYVINYL ALCOHOL 1.4 % OP SOLN
1.0000 [drp] | Freq: Four times a day (QID) | OPHTHALMIC | Status: DC | PRN
  Filled 2020-01-22: qty 15

## 2020-01-22 MED ORDER — MIDAZOLAM HCL 2 MG/2ML IJ SOLN: 2.0000 mg | INTRAMUSCULAR | Status: DC | PRN

## 2020-01-22 MED ORDER — DILTIAZEM HCL-DEXTROSE 125-5 MG/125ML-% IV SOLN (PREMIX): 5.0000 mg/h | INTRAVENOUS | Status: DC

## 2020-01-22 MED ORDER — MORPHINE SULFATE (PF) 2 MG/ML IV SOLN: 2.0000 mg | INTRAVENOUS | Status: DC | PRN

## 2020-01-22 MED ORDER — AMIODARONE HCL IN DEXTROSE 360-4.14 MG/200ML-% IV SOLN: 30.0000 mg/h | INTRAVENOUS | Status: DC

## 2020-01-22 MED ORDER — AMIODARONE HCL IN DEXTROSE 360-4.14 MG/200ML-% IV SOLN
60.0000 mg/h | INTRAVENOUS | Status: AC
  Administered 2020-01-22 (×2): 60 mg/h via INTRAVENOUS
  Filled 2020-01-22 (×2): qty 200

## 2020-01-22 MED ORDER — DILTIAZEM HCL ER COATED BEADS 180 MG PO CP24
180.0000 mg | ORAL_CAPSULE | Freq: Every day | ORAL | Status: DC
  Administered 2020-01-22: 180 mg via ORAL
  Filled 2020-01-22: qty 1

## 2020-01-22 MED ORDER — DEXTROSE 5 % IV SOLN: INTRAVENOUS | Status: DC

## 2020-01-23 LAB — ECHOCARDIOGRAM COMPLETE
Height: 63 in
Weight: 1834.23 oz

## 2020-01-26 NOTE — Progress Notes (Signed)
PROGRESS NOTE    April Barrett   YWV:[id number]  DOB: 1928/04/20  PCP: [facility]    DOA: Feb 19, 2020 LOS: 1   Brief Narrative   86 y.o. female past medical history significant for DNR/DNI status, CHF (EF 50% on 11/2019), paroxysmal atrial fibrillation not on anticoagulation due to previous GI bleed, CAD, GERD, hypertension, hyperlipidemia, pulmonary hypertension who presents to [facility] ED on 02-19-2020 from [facility] due to altered mental status.     Admitted to stepdown early hours of Feb 19, 2020 with acute respiratory failure with hypoxia secondary to acute on chronic systolic CHF, elevated troponin in setting of demand ischemia vs NSTEMI, and AKI with Hyperkalemia.  [facility] Cardiology consulted.  PCCM admitted patient to stepdown initially.  Patient clinically improved and hemodynamically stable, has not required bipap but could potentially require it at nighttime.  As of afternoon 4/26, requiring 3-4 L/min nasal cannula oxygen.  Assumed care of patient on 4/27.  Patient seen with two daughters at bedside in the AM.  Patient was asleep but woke up during questions and exam.  Later in the day, patient had a sudden decompensation with bradycardia and worsened hypoxia.  Patient stated to her daughter and to other staff that she was dying.  She was already DNR status, and she declined to be placed on bipap.  Patient and family agreed to pursue full comfort measures.     Assessment & Plan   Active Problems:   CHF (congestive heart failure) (HCC)   COMFORT CARE STATUS:  Please administer comfort meds per orders and monitor patient closely for signs of distress, discomfort or pain.    Patient BMI: Body mass index is 20.31 kg/m.   DVT prophylaxis: none (comfort) Diet:  Diet Orders (From admission, onward)    Start     Ordered   02-19-20 1537  Diet regular Room service appropriate? Yes; Fluid consistency: Thin  Diet effective now    Question Answer Comment  Room  service appropriate? Yes   Fluid consistency: Thin      2020/02/19 1536            Code Status: DNR    Subjective 01/08/2020    Patient reports she is tired.  Fully oriented and in no distress.  Mildly confused but answers questions appropriately and was listening to my conversation with her daughters, said "I don't like all that talking". Denied chest pain, SOB, fever/chills or other complaints.  Said her leg swelling felt better, daughters indicated it looked much better.   Disposition Plan & Communication   Dispo & Barriers: expect in hospital death, imminent  Coming from: ALF Exp d/c date: n/a Medically stable for d/c? no  Family Communication: two daughters at bedside    Consults, Procedures, Significant Events   Consultants:   Cardiology  Nephrology  PCCM   Objective   Vitals:   01/10/2020 1000 12/30/2019 1100 01/02/2020 1200 01/12/2020 1546  BP: 100/72 107/86 108/85   Pulse: 99 63 95 (!) 0  Resp: 20 19 20  (!) 0  Temp:   98 F (36.7 C)   TempSrc:   Oral   SpO2: 96% 97% 97%   Weight:      Height:        Intake/Output Summary (Last 24 hours) at 01/14/2020 1743 Last data filed at 01/03/2020 1600 Gross per 24 hour  Intake 253.94 ml  Output 150 ml  Net 103.94 ml   Filed Weights   Feb 19, 2020 0124 02-19-20 1356 01/14/2020  0439  Weight: 50 kg 52.4 kg 52 kg    Physical Exam:  General exam: sleeping but wakes up to talk, no acute distress Respiratory system: CTAB, no wheezes, rales or rhonchi, normal respiratory effort. Cardiovascular system: irregularly irregular, 2+ BLE pitting edema. Central nervous system: Oriented x4. normal speech, CN's II-XII intact, upper and lower extremity motor intact and symmetric Psychiatry: normal mood, congruent affect, judgement and insight appear normal  Labs   Data Reviewed: I have personally reviewed following labs and imaging studies  CBC: Recent Labs  Lab 12/31/2019 0128 02/04/20 0420  WBC 12.4* 14.8*  NEUTROABS 10.1*  --    HGB 12.5 12.5  HCT 37.9 37.5  MCV 95.7 94.9  PLT 194 299   Basic Metabolic Panel: Recent Labs  Lab 01/04/2020 0128 01/06/2020 1105 2020-02-04 0420 02/04/2020 1248  NA 124* 129* 130*  --   K 5.8* 4.8 5.3* 5.4*  CL 91* 96* 97*  --   CO2 19* 24 20*  --   GLUCOSE 288* 100* 80  --   BUN 46* 45* 55*  --   CREATININE 1.79* 1.31* 1.50*  --   CALCIUM 9.3 9.3 9.6  --   MG  --  2.4 2.4  --   PHOS  --   --  4.9*  --    GFR: Estimated Creatinine Clearance: 20.9 mL/min (A) (by C-G formula based on SCr of 1.5 mg/dL (H)). Liver Function Tests: Recent Labs  Lab 01/05/2020 0128  AST 195*  ALT 274*  ALKPHOS 139*  BILITOT 1.2  PROT 6.4*  ALBUMIN 3.3*   No results for input(s): LIPASE, AMYLASE in the last 168 hours. No results for input(s): AMMONIA in the last 168 hours. Coagulation Profile: No results for input(s): INR, PROTIME in the last 168 hours. Cardiac Enzymes: No results for input(s): CKTOTAL, CKMB, CKMBINDEX, TROPONINI in the last 168 hours. BNP (last 3 results) No results for input(s): PROBNP in the last 8760 hours. HbA1C: Recent Labs    01/23/2020 1105  HGBA1C 5.4   CBG: Recent Labs  Lab 01/24/2020 2015 01/16/2020 2357 2020-02-04 0404 02/04/20 0712 02-04-2020 1233  GLUCAP 80 81 88 92 146*   Lipid Profile: No results for input(s): CHOL, HDL, LDLCALC, TRIG, CHOLHDL, LDLDIRECT in the last 72 hours. Thyroid Function Tests: Recent Labs    01/07/2020 1105  TSH 3.995   Anemia Panel: No results for input(s): VITAMINB12, FOLATE, FERRITIN, TIBC, IRON, RETICCTPCT in the last 72 hours. Sepsis Labs: Recent Labs  Lab 01/12/2020 0600  PROCALCITON 0.69    Recent Results (from the past 240 hour(s))  Respiratory Panel by RT PCR (Flu A&B, Covid) - Nasopharyngeal Swab     Status: None   Collection Time: 01/07/2020  2:04 AM   Specimen: Nasopharyngeal Swab  Result Value Ref Range Status   SARS Coronavirus 2 by RT PCR NEGATIVE NEGATIVE Final    Comment: (NOTE) SARS-CoV-2 target nucleic  acids are NOT DETECTED. The SARS-CoV-2 RNA is generally detectable in upper respiratoy specimens during the acute phase of infection. The lowest concentration of SARS-CoV-2 viral copies this assay can detect is 131 copies/mL. A negative result does not preclude SARS-Cov-2 infection and should not be used as the sole basis for treatment or other patient management decisions. A negative result may occur with  improper specimen collection/handling, submission of specimen other than nasopharyngeal swab, presence of viral mutation(s) within the areas targeted by this assay, and inadequate number of viral copies (<131 copies/mL). A negative result must be  combined with clinical observations, patient history, and epidemiological information. The expected result is Negative. Fact Sheet for Patients:  https://www.moore.com/ Fact Sheet for Healthcare Providers:  https://www.young.biz/ This test is not yet ap proved or cleared by the [place] FDA and  has been authorized for detection and/or diagnosis of SARS-CoV-2 by FDA under an Emergency Use Authorization (EUA). This EUA will remain  in effect (meaning this test can be used) for the duration of the COVID-19 declaration under Section 564(b)(1) of the Act, 21 U.S.C. section 360bbb-3(b)(1), unless the authorization is terminated or revoked sooner.    Influenza A by PCR NEGATIVE NEGATIVE Final   Influenza B by PCR NEGATIVE NEGATIVE Final    Comment: (NOTE) The Xpert Xpress SARS-CoV-2/FLU/RSV assay is intended as an aid in  the diagnosis of influenza from Nasopharyngeal swab specimens and  should not be used as a sole basis for treatment. Nasal washings and  aspirates are unacceptable for Xpert Xpress SARS-CoV-2/FLU/RSV  testing. Fact Sheet for Patients: https://www.moore.com/ Fact Sheet for Healthcare Providers: https://www.young.biz/ This test is not yet  approved or cleared by the [place] FDA and  has been authorized for detection and/or diagnosis of SARS-CoV-2 by  FDA under an Emergency Use Authorization (EUA). This EUA will remain  in effect (meaning this test can be used) for the duration of the  Covid-19 declaration under Section 564(b)(1) of the Act, 21  U.S.C. section 360bbb-3(b)(1), unless the authorization is  terminated or revoked. Performed at [facility], [address] [place], [place] [address]   MRSA PCR Screening     Status: None   Collection Time: 02/20/2020  3:06 PM   Specimen: Nasopharyngeal  Result Value Ref Range Status   MRSA by PCR NEGATIVE NEGATIVE Final    Comment:        The GeneXpert MRSA Assay (FDA approved for NASAL specimens only), is one component of a comprehensive MRSA colonization surveillance program. It is not intended to diagnose MRSA infection nor to guide or monitor treatment for MRSA infections. Performed at [facility], [address] [place], [place] [address]       Imaging Studies   CT Angio Chest PE W and/or Wo Contrast  Result Date: Feb 20, 2020 CLINICAL DATA:  Hypoxia EXAM: CT ANGIOGRAPHY CHEST WITH CONTRAST TECHNIQUE: Multidetector CT imaging of the chest was performed using the standard protocol during bolus administration of intravenous contrast. Multiplanar CT image reconstructions and MIPs were obtained to evaluate the vascular anatomy. CONTRAST:  41mL OMNIPAQUE IOHEXOL 350 MG/ML SOLN COMPARISON:  02/17/2014 FINDINGS: Cardiovascular: Cardiomegaly. No pericardial effusion. Limited pulmonary artery opacification due to early bolus and respiratory motion. No central/lobar embolism is seen when accounting for mixing artifact at the levels of the left and right pulmonary arteries. There is renal failure such that automatic repeat was not performed. Heavily calcified aorta without dilatation or visible intramural hematoma. Coronary atherosclerosis.  Mediastinum/Nodes: No adenopathy or pneumomediastinum. Lungs/Pleura: Mild streaky opacity most consistent with atelectasis. Trace pleural effusions. No pulmonary edema or consolidation. Upper Abdomen: Extensive atherosclerotic calcification. Musculoskeletal: Severe spinal degeneration and scoliosis. Remote appearing T3 superior endplate and T5 compression fractures. Remote left posterior rib fractures. Review of the MIP images confirms the above findings. IMPRESSION: 1. Very limited PE study, no embolism seen to the level of the main pulmonary arteries. 2. Cardiomegaly without failure. 3.  Aortic Atherosclerosis (ICD10-I70.0) that is extensive. 4. Nonacute T3 and T5 compression fractures. Electronically Signed   By: Marnee Spring M.D.   On: Feb 20, 2020  04:18   US RENAL  Result Date: 02-02-2020 CLINICAL DATA:  Acute kidney injury EXAM: RENAL / URINARY TRACT ULTRASOUND COMPLETE COMPARISON:  Ultrasound 02/17/2014 FINDINGS: Right Kidney: Renal measurements: 7.1 x 3.5 x 3.9 cm = volume: 51 mL. Parenchyma isoechoic to the adjacent liver. Mild diffuse parenchymal thinning. No mass or hydronephrosis visualized. Left Kidney: Renal measurements: 9.7 x 4.7 x 3.9 cm = volume: 93 mL. Echogenicity within normal limits. No mass or hydronephrosis visualized. Bladder: Appears normal for degree of bladder distention. Other: None. IMPRESSION: Bilateral renal parenchymal atrophy.  No hydronephrosis. Electronically Signed   By: Corlis Leak M.D.   On: 02/02/2020 09:04   DG Chest Port 1 View  Result Date: 01/19/2020 CLINICAL DATA:  Acute hypoxic respiratory failure EXAM: PORTABLE CHEST 1 VIEW COMPARISON:  Yesterday FINDINGS: Low volume chest which accentuates cardiomegaly. Hazy appearance at the bases from atelectasis and small pleural effusions as seen by CT. No convincing change from prior. Extensive arterial calcification. IMPRESSION: Cardiomegaly with atelectasis and small pleural effusions. No change from chest CT yesterday.  Electronically Signed   By: Marnee Spring M.D.   On: 01/13/2020 05:29   DG Chest Portable 1 View  Result Date: 02-02-20 CLINICAL DATA:  Hypoxia. EXAM: PORTABLE CHEST 1 VIEW COMPARISON:  Feb 15, 2014 FINDINGS: Decreased lung volumes are seen which is likely, in part secondary to suboptimal patient inspiration. Mild, stable chronic appearing increased lung markings are noted bilaterally. A small left pleural effusion is seen. No pneumothorax is identified. The cardiac silhouette is moderately enlarged and unchanged in size. There is marked severity calcification of the thoracic aorta. Multilevel degenerative changes seen throughout the thoracic spine. IMPRESSION: Chronic appearing increased lung markings with a small left pleural effusion. Electronically Signed   By: Aram Candela M.D.   On: 02-02-20 02:07   US Abdomen Limited RUQ  Result Date: 2020/02/02 CLINICAL DATA:  Elevated LFTs EXAM: ULTRASOUND ABDOMEN LIMITED RIGHT UPPER QUADRANT COMPARISON:  CT abdomen 12/12/2013 FINDINGS: Gallbladder: No gallstones or wall thickening visualized. Incompletely distended. No sonographic Murphy sign noted by sonographer. Common bile duct: Diameter: 2.5 mm, unremarkable Liver: No focal lesion identified. Within normal limits in parenchymal echogenicity. Portal vein is patent on color Doppler imaging with normal direction of blood flow towards the liver. Other: Bilateral pleural effusions.  Trace perihepatic ascites. IMPRESSION: 1. Normal evaluation of the gallbladder and liver. 2. Bilateral pleural effusions and trace perihepatic ascites. Electronically Signed   By: Corlis Leak M.D.   On: 02/02/2020 09:06     Medications   Scheduled Meds: . Chlorhexidine Gluconate Cloth  6 each Topical Daily   Continuous Infusions:     LOS: 1 day    Time spent: 42 minutes total time spent with > 50% at bedside and in coordinating care    Pennie Banter, DO [facility]   If 7PM-7AM, please contact  night-coverage [email or web address] 01/10/2020, 5:43 PM

## 2020-01-26 NOTE — Consult Note (Signed)
[facility]  CONSULT NOTE    Date: Feb 20, 2020                  Patient Name:  April Barrett  MRN: [id number]  DOB: 1928-04-25  Age / Sex: 86 y.o., female         PCP: [facility]                 Service Requesting Consult: Dr. Valentina Lucks                 Reason for Consult: Acute renal failure with hyperkalemia            History of Present Illness: Ms. April Barrett admitted yesterday for altered mental status. Patient with acute respiratory failure with hypoxia. Patient was then admitted to ICU where patient was in atrial fibrillation with rapid ventricular response. Placed on IV amiodarone infusion. Placed on BIPAP.   Medications: Outpatient medications: Medications Prior to Admission  Medication Sig Dispense Refill Last Dose  . acetaminophen (TYLENOL) 325 MG tablet Take 650 mg by mouth 2 (two) times daily. For Rib Pain   Unknown at Unknown  . aspirin EC 81 MG tablet Take 81 mg by mouth daily.    Unknown at Unknown  . calcium carbonate (TUMS - DOSED IN MG ELEMENTAL CALCIUM) 500 MG chewable tablet Chew 2 tablets by mouth daily.    Unknown at Unknown  . Cholecalciferol (VITAMIN D-1000 MAX ST) 1000 units tablet Take 1,000 Units by mouth daily.    Unknown at Unknown  . Cyanocobalamin (RA VITAMIN B-12 TR) 1000 MCG TBCR Take 1,000 mcg by mouth daily.    Unknown at Unknown  . diltiazem (TIAZAC) 180 MG 24 hr capsule Take 1 capsule (180 mg total) by mouth daily. 30 capsule 11 Unknown at Unknown  . escitalopram (LEXAPRO) 5 MG tablet TAKE ONE TABLET BY MOUTH EVERY MORNING 30 tablet 5 Unknown at Unknown  . furosemide (LASIX) 40 MG tablet Take 2 tablets (80 mg total) by mouth daily. 1 tablet 0 Unknown at Unknown  . levothyroxine (SYNTHROID) 100 MCG tablet Take 1 tablet (100 mcg total) by mouth daily. 30 tablet 11 Unknown at Unknown  . metoprolol succinate (TOPROL-XL) 100 MG 24 hr tablet Take 1 tablet (100 mg total) by mouth 2 (two) times daily. 60 tablet 11  Unknown at Unknown  . potassium chloride (KLOR-CON 10) 10 MEQ tablet Take 1 tablet (10 mEq total) by mouth 2 (two) times daily. 60 tablet 11 Unknown at Unknown  . sennosides-docusate sodium (SENOKOT-S) 8.6-50 MG tablet Take 2 tablets by mouth daily.   Unknown at Unknown  . traZODone (DESYREL) 50 MG tablet Take 1 tablet (50 mg total) by mouth at bedtime. 30 tablet 11 Unknown at Unknown    Current medications: Current Facility-Administered Medications  Medication Dose Route Frequency Provider Last Rate Last Admin  . acetaminophen (TYLENOL) tablet 650 mg  650 mg Oral Q4H PRN Harlon Ditty D, NP      . amiodarone (NEXTERONE PREMIX) 360-4.14 MG/200ML-% (1.8 mg/mL) IV infusion  60 mg/hr Intravenous Continuous Callwood, Dwayne D, MD 33.3 mL/hr at 02/20/2020 1305 60 mg/hr at 2020/02/20 1305   Followed by  . amiodarone (NEXTERONE PREMIX) 360-4.14 MG/200ML-% (1.8 mg/mL) IV infusion  30 mg/hr Intravenous Continuous Callwood, Dwayne D, MD      . aspirin EC tablet 325 mg  325 mg Oral Daily Erin Fulling, MD   325 mg at 2020/02/20 0908  . Chlorhexidine Gluconate  Cloth 2 % PADS 6 each  6 each Topical Daily Esaw Grandchild A, DO      . diltiazem (CARDIZEM CD) 24 hr capsule 180 mg  180 mg Oral Daily Esaw Grandchild A, DO   180 mg at 02/21/2020 0933  . docusate sodium (COLACE) capsule 100 mg  100 mg Oral BID PRN Harlon Ditty D, NP      . escitalopram (LEXAPRO) tablet 5 mg  5 mg Oral q morning - 10a Harlon Ditty D, NP   5 mg at 02/21/20 0908  . insulin aspart (novoLOG) injection 0-15 Units  0-15 Units Subcutaneous Q4H Harlon Ditty D, NP   2 Units at 02/21/20 1235  . ipratropium-albuterol (DUONEB) 0.5-2.5 (3) MG/3ML nebulizer solution 3 mL  3 mL Nebulization Q4H PRN Harlon Ditty D, NP      . levothyroxine (SYNTHROID) tablet 100 mcg  100 mcg Oral Daily Harlon Ditty D, NP   100 mcg at 2020-02-21 0938  . metoprolol succinate (TOPROL-XL) 24 hr tablet 100 mg  100 mg Oral BID Harlon Ditty D, NP   100 mg at  02-21-20 0908  . metoprolol tartrate (LOPRESSOR) injection 2.5 mg  2.5 mg Intravenous Q6H PRN Eugenie Norrie, NP      . ondansetron (ZOFRAN) injection 4 mg  4 mg Intravenous Q6H PRN Harlon Ditty D, NP      . polyethylene glycol (MIRALAX / GLYCOLAX) packet 17 g  17 g Oral Daily PRN Harlon Ditty D, NP      . traZODone (DESYREL) tablet 50 mg  50 mg Oral QHS Harlon Ditty D, NP   50 mg at 01/20/2020 2208      Allergies: Allergies  Allergen Reactions  . Ace Inhibitors Cough  . Amlodipine Swelling  . Statins Other (See Comments)      Past Medical History: Past Medical History:  Diagnosis Date  . Aortic stenosis   . Carotid artery disease (HCC)   . Chronic diastolic heart failure (HCC)   . Coronary atherosclerosis of native coronary artery   . Essential hypertension, benign   . Gastroesophageal reflux disease   . History of kidney stones   . Hyperlipidemia   . Hypothyroidism   . Paroxysmal atrial fibrillation (HCC)   . Scoliosis      Past Surgical History: Past Surgical History:  Procedure Laterality Date  . CATARACT EXTRACTION    . Childbirth  [phone]  . TTA       Family History: Family History  Problem Relation Age of Onset  . Hypertension Mother   . AAA (abdominal aortic aneurysm) Mother   . Stroke Mother   . Alzheimer's disease Father   . Prostate cancer Father   . Asthma Sister   . Hypertension Maternal Grandmother   . Colon cancer Maternal Grandmother   . Breast cancer Paternal Aunt      Social History: Social History   Socioeconomic History  . Marital status: Widowed    Spouse name: Not on file  . Number of children: 2  . Years of education: Not on file  . Highest education level: Not on file  Occupational History  . Not on file  Tobacco Use  . Smoking status: Never Smoker  . Smokeless tobacco: Never Used  Substance and Sexual Activity  . Alcohol use: No    Alcohol/week: 0.0 standard drinks  . Drug use: Not on file  . Sexual  activity: Not on file  Other Topics Concern  . Not on file  Social  History Narrative   Widowed 2007   2 daughters----[place] and Claris Gower      Has living will   Daughter Darl Pikes is health care POA   Has DNR   Would accept hospital but no tube feeds   Social Determinants of Health   Financial Resource Strain:   . Difficulty of Paying Living Expenses:   Food Insecurity:   . Worried About Programme researcher, broadcasting/film/video in the Last Year:   . Barista in the Last Year:   Transportation Needs:   . Freight forwarder (Medical):   Marland Kitchen Lack of Transportation (Non-Medical):   Physical Activity:   . Days of Exercise per Week:   . Minutes of Exercise per Session:   Stress:   . Feeling of Stress :   Social Connections:   . Frequency of Communication with Friends and Family:   . Frequency of Social Gatherings with Friends and Family:   . Attends Religious Services:   . Active Member of Clubs or Organizations:   . Attends Banker Meetings:   Marland Kitchen Marital Status:   Intimate Partner Violence:   . Fear of Current or Ex-Partner:   . Emotionally Abused:   Marland Kitchen Physically Abused:   . Sexually Abused:      Review of Systems: Review of Systems  Unable to perform ROS: Mental status change    Vital Signs: Blood pressure 108/85, pulse 95, temperature 98 F (36.7 C), temperature source Oral, resp. rate 20, height  (1.6 m), weight 52 kg, SpO2 97 %.  Weight trends: Filed Weights   15-Feb-2020 0124 02-15-2020 1356 01/15/2020 0439  Weight: 50 kg 52.4 kg 52 kg    Physical Exam: General: NAD, laying in bed  Head: Normocephalic, atraumatic. Moist oral mucosal membranes  Eyes: Anicteric, PERRL  Neck: Supple, trachea midline  Lungs:  Bilateral crackles  Heart: irregular  Abdomen:  Soft, nontender,   Extremities:  + peripheral edema.  Neurologic: Nonfocal, moving all four extremities  Skin: No lesions         Lab results: Basic Metabolic Panel: Recent Labs  Lab  2020-02-15 0128 2020/02/15 0128 02/15/2020 1105 12/31/2019 0420 01/07/2020 1248  NA 124*  --  129* 130*  --   K 5.8*   < > 4.8 5.3* 5.4*  CL 91*  --  96* 97*  --   CO2 19*  --  24 20*  --   GLUCOSE 288*  --  100* 80  --   BUN 46*  --  45* 55*  --   CREATININE 1.79*  --  1.31* 1.50*  --   CALCIUM 9.3  --  9.3 9.6  --   MG  --   --  2.4 2.4  --   PHOS  --   --   --  4.9*  --    < > = values in this interval not displayed.    Liver Function Tests: Recent Labs  Lab 02/15/20 0128  AST 195*  ALT 274*  ALKPHOS 139*  BILITOT 1.2  PROT 6.4*  ALBUMIN 3.3*   No results for input(s): LIPASE, AMYLASE in the last 168 hours. No results for input(s): AMMONIA in the last 168 hours.  CBC: Recent Labs  Lab 02/15/2020 0128 01/03/2020 0420  WBC 12.4* 14.8*  NEUTROABS 10.1*  --   HGB 12.5 12.5  HCT 37.9 37.5  MCV 95.7 94.9  PLT 194 199    Cardiac Enzymes: No results for input(s): CKTOTAL,  CKMB, CKMBINDEX, TROPONINI in the last 168 hours.  BNP: Invalid input(s): POCBNP  CBG: Recent Labs  Lab 01/03/2020 2015 01/14/2020 2357 02/05/2020 0404 02/05/20 0712 02-05-20 1233  GLUCAP 80 81 88 92 146*    Microbiology: Results for orders placed or performed during the hospital encounter of 01/19/2020  Respiratory Panel by RT PCR (Flu A&B, Covid) - Nasopharyngeal Swab     Status: None   Collection Time: 01/08/2020  2:04 AM   Specimen: Nasopharyngeal Swab  Result Value Ref Range Status   SARS Coronavirus 2 by RT PCR NEGATIVE NEGATIVE Final    Comment: (NOTE) SARS-CoV-2 target nucleic acids are NOT DETECTED. The SARS-CoV-2 RNA is generally detectable in upper respiratoy specimens during the acute phase of infection. The lowest concentration of SARS-CoV-2 viral copies this assay can detect is 131 copies/mL. A negative result does not preclude SARS-Cov-2 infection and should not be used as the sole basis for treatment or other patient management decisions. A negative result may occur with  improper  specimen collection/handling, submission of specimen other than nasopharyngeal swab, presence of viral mutation(s) within the areas targeted by this assay, and inadequate number of viral copies (<131 copies/mL). A negative result must be combined with clinical observations, patient history, and epidemiological information. The expected result is Negative. Fact Sheet for Patients:  PinkCheek.be Fact Sheet for Healthcare Providers:  GravelBags.it This test is not yet ap proved or cleared by the [place] FDA and  has been authorized for detection and/or diagnosis of SARS-CoV-2 by FDA under an Emergency Use Authorization (EUA). This EUA will remain  in effect (meaning this test can be used) for the duration of the COVID-19 declaration under Section 564(b)(1) of the Act, 21 U.S.C. section 360bbb-3(b)(1), unless the authorization is terminated or revoked sooner.    Influenza A by PCR NEGATIVE NEGATIVE Final   Influenza B by PCR NEGATIVE NEGATIVE Final    Comment: (NOTE) The Xpert Xpress SARS-CoV-2/FLU/RSV assay is intended as an aid in  the diagnosis of influenza from Nasopharyngeal swab specimens and  should not be used as a sole basis for treatment. Nasal washings and  aspirates are unacceptable for Xpert Xpress SARS-CoV-2/FLU/RSV  testing. Fact Sheet for Patients: PinkCheek.be Fact Sheet for Healthcare Providers: GravelBags.it This test is not yet approved or cleared by the [place] FDA and  has been authorized for detection and/or diagnosis of SARS-CoV-2 by  FDA under an Emergency Use Authorization (EUA). This EUA will remain  in effect (meaning this test can be used) for the duration of the  Covid-19 declaration under Section 564(b)(1) of the Act, 21  U.S.C. section 360bbb-3(b)(1), unless the authorization is  terminated or revoked. Performed at [facility], [address] [place], [place] [address]   MRSA PCR Screening     Status: None   Collection Time: 01/19/2020  3:06 PM   Specimen: Nasopharyngeal  Result Value Ref Range Status   MRSA by PCR NEGATIVE NEGATIVE Final    Comment:        The GeneXpert MRSA Assay (FDA approved for NASAL specimens only), is one component of a comprehensive MRSA colonization surveillance program. It is not intended to diagnose MRSA infection nor to guide or monitor treatment for MRSA infections. Performed at [facility], [address] [place], [place] [address]     Coagulation Studies: No results for input(s): LABPROT, INR in the last 72 hours.  Urinalysis: Recent Labs    01/11/2020 [place]  LABSPEC  1.020  PHURINE 5.0  GLUCOSEU NEGATIVE  HGBUR TRACE*  BILIRUBINUR NEGATIVE  KETONESUR NEGATIVE  PROTEINUR TRACE*  NITRITE NEGATIVE  LEUKOCYTESUR NEGATIVE      Imaging: CT Angio Chest PE W and/or Wo Contrast  Result Date: 01/14/2020 CLINICAL DATA:  Hypoxia EXAM: CT ANGIOGRAPHY CHEST WITH CONTRAST TECHNIQUE: Multidetector CT imaging of the chest was performed using the standard protocol during bolus administration of intravenous contrast. Multiplanar CT image reconstructions and MIPs were obtained to evaluate the vascular anatomy. CONTRAST:  60mL OMNIPAQUE IOHEXOL 350 MG/ML SOLN COMPARISON:  02/17/2014 FINDINGS: Cardiovascular: Cardiomegaly. No pericardial effusion. Limited pulmonary artery opacification due to early bolus and respiratory motion. No central/lobar embolism is seen when accounting for mixing artifact at the levels of the left and right pulmonary arteries. There is renal failure such that automatic repeat was not performed. Heavily calcified aorta without dilatation or visible intramural hematoma. Coronary atherosclerosis. Mediastinum/Nodes: No adenopathy or pneumomediastinum. Lungs/Pleura: Mild streaky opacity most consistent with atelectasis.  Trace pleural effusions. No pulmonary edema or consolidation. Upper Abdomen: Extensive atherosclerotic calcification. Musculoskeletal: Severe spinal degeneration and scoliosis. Remote appearing T3 superior endplate and T5 compression fractures. Remote left posterior rib fractures. Review of the MIP images confirms the above findings. IMPRESSION: 1. Very limited PE study, no embolism seen to the level of the main pulmonary arteries. 2. Cardiomegaly without failure. 3.  Aortic Atherosclerosis (ICD10-I70.0) that is extensive. 4. Nonacute T3 and T5 compression fractures. Electronically Signed   By: Marnee SpringJonathon  Watts M.D.   On: 01/07/2020 04:18   US RENAL  Result Date: 01/14/2020 CLINICAL DATA:  Acute kidney injury EXAM: RENAL / URINARY TRACT ULTRASOUND COMPLETE COMPARISON:  Ultrasound 02/17/2014 FINDINGS: Right Kidney: Renal measurements: 7.1 x 3.5 x 3.9 cm = volume: 51 mL. Parenchyma isoechoic to the adjacent liver. Mild diffuse parenchymal thinning. No mass or hydronephrosis visualized. Left Kidney: Renal measurements: 9.7 x 4.7 x 3.9 cm = volume: 93 mL. Echogenicity within normal limits. No mass or hydronephrosis visualized. Bladder: Appears normal for degree of bladder distention. Other: None. IMPRESSION: Bilateral renal parenchymal atrophy.  No hydronephrosis. Electronically Signed   By: Corlis Leak  Hassell M.D.   On: 01/19/2020 09:04   DG Chest Port 1 View  Result Date: Jan 09, 2020 CLINICAL DATA:  Acute hypoxic respiratory failure EXAM: PORTABLE CHEST 1 VIEW COMPARISON:  Yesterday FINDINGS: Low volume chest which accentuates cardiomegaly. Hazy appearance at the bases from atelectasis and small pleural effusions as seen by CT. No convincing change from prior. Extensive arterial calcification. IMPRESSION: Cardiomegaly with atelectasis and small pleural effusions. No change from chest CT yesterday. Electronically Signed   By: Marnee SpringJonathon  Watts M.D.   On: 0Apr 14, 2021 05:29   DG Chest Portable 1 View  Result Date:  12/27/2019 CLINICAL DATA:  Hypoxia. EXAM: PORTABLE CHEST 1 VIEW COMPARISON:  Feb 15, 2014 FINDINGS: Decreased lung volumes are seen which is likely, in part secondary to suboptimal patient inspiration. Mild, stable chronic appearing increased lung markings are noted bilaterally. A small left pleural effusion is seen. No pneumothorax is identified. The cardiac silhouette is moderately enlarged and unchanged in size. There is marked severity calcification of the thoracic aorta. Multilevel degenerative changes seen throughout the thoracic spine. IMPRESSION: Chronic appearing increased lung markings with a small left pleural effusion. Electronically Signed   By: Aram Candelahaddeus  Houston M.D.   On: 01/04/2020 02:07   US Abdomen Limited RUQ  Result Date: 01/12/2020 CLINICAL DATA:  Elevated LFTs EXAM: ULTRASOUND ABDOMEN LIMITED RIGHT UPPER QUADRANT COMPARISON:  CT abdomen 12/12/2013 FINDINGS: Gallbladder:  No gallstones or wall thickening visualized. Incompletely distended. No sonographic Murphy sign noted by sonographer. Common bile duct: Diameter: 2.5 mm, unremarkable Liver: No focal lesion identified. Within normal limits in parenchymal echogenicity. Portal vein is patent on color Doppler imaging with normal direction of blood flow towards the liver. Other: Bilateral pleural effusions.  Trace perihepatic ascites. IMPRESSION: 1. Normal evaluation of the gallbladder and liver. 2. Bilateral pleural effusions and trace perihepatic ascites. Electronically Signed   By: Corlis Leak M.D.   On: 12/29/2019 09:06      Assessment & Plan: Ms. SHELLEE STRENG is a 86 y.o. white female with congestive heart failure, coronary artery disease, hypertension, atrial fibrillation, pulmonary hypertension, scoliosis, who was admitted to [facility] on 01/24/2020 for Hyperkalemia [E87.5] CHF (congestive heart failure) (HCC) [I50.9] Hyponatremia [E87.1] Elevated LFTs [R79.89] NSTEMI (non-ST elevated myocardial infarction) (HCC) [I21.4] Acute  respiratory failure with hypoxia (HCC) [J96.01] AKI (acute kidney injury) (HCC) [N17.9] Atrial fibrillation with RVR (HCC) [I48.91] Acute on chronic congestive heart failure, unspecified heart failure type (HCC) [I50.9]  1. Acute renal failure with hyperkalemia and metabolic acidosis on chronic kidney disease stage IIIA with hematuria and proteinuria.  Baseline creatinine of 1, GFR of 52 on 07/04/2018.  Chronic kidney disease secondary to hypertension Acute renal failure with IV contrast exposure on 4/26 and acute cardiorenal syndrome.  Ultrasound with no obstruction.  No acute indication for dialysis.   2. Hypertension: with acute exacerbation of systolic and diastolic congestive heart failure and atrial fibrillation.  Home regimen of dilitazem, metoprolol and furosemide 80mg  daily.  New echocardiogram is pending.  Amiodarone gtt.  - diuretics: furosemide 80mg  PO daily.   LOS: 1 Jakorian Marengo 01-May-20211:56 PM

## 2020-01-26 NOTE — Progress Notes (Signed)
Family At bedside, clinical status relayed to family Daughter at bedside  Updated and notified of patients medical condition-  Progressive multiorgan failure with very low chance of meaningful recovery.  Patient is in dying  Process from progressive systolic CHF   Daughter understands the situation. I have explained that we can try BiPAP and try High Flow [place] Daughter stated that our goal is to make her comfortable  They have consented and agreed to DNR/DNI and would like to proceed with Comfort care measures.   Family are satisfied with Plan of action and management. All questions answered   Additional CC time 31 mins  April Barrett Santiago Glad, M.D.  [facility]  Medical Director [facility] Medical Director [facility] Cardio-Pulmonary Department

## 2020-01-26 NOTE — Progress Notes (Signed)
Ch visited pt. while rounding on ICU and observing pt.'s dtrs. @ bedside.  Dtrs. said pt. was found unresponsive yesterday and taken to hospital; pt. in ED for an extended time before being transferred to ICU.  One dtr. drove up from [place]; other is local.  Dtrs. shared they have no other siblings --are grateful for one another's company/support.  Dtrs. asked when MD would come update them re: pt.'s status; CH shared he believes pt. has orders to be transferred to floor (per ICU rounds and conv. w/RN); [facility] sent secure chat to Dr. Denton Lank to request follow-up w/family.  When [facility] checked back some time later dtr. expressed gratitude for doctor's visit.  No further needs expressed.    12/27/2019 1045  Clinical Encounter Type  Visited With Patient not available;Family  Visit Type Initial;Critical Care;Social support;Psychological support  Referral From Other (Comment) (Routine Rounding)  Spiritual Encounters  Spiritual Needs Emotional  Stress Factors  Family Stress Factors Lack of knowledge;Major life changes;Health changes

## 2020-01-26 NOTE — Progress Notes (Signed)
CRITICAL CARE NOTE  CC  follow up respiratory failure  SUBJECTIVE Patient remains critically ill Prognosis is guarded Patient is dying process   BP 108/85 (BP Location: Left Arm)   Pulse 95   Temp 98 F (36.7 C) (Oral)   Resp 20   Ht 5\' 3"  (1.6 m)   Wt 52 kg   SpO2 97%   BMI 20.31 kg/m    I/O last 3 completed shifts: In: -  Out: 150 [Urine:150] Total I/O In: 154.7 [I.V.:154.7] Out: -   SpO2: 97 % O2 Flow Rate (L/min): 3 L/min  Estimated body mass index is 20.31 kg/m as calculated from the following:   Height as of this encounter: 5\' 3"  (1.6 m).   Weight as of this encounter: 52 kg.  SIGNIFICANT EVENTS   REVIEW OF SYSTEMS  PATIENT IS UNABLE TO PROVIDE COMPLETE REVIEW OF SYSTEMS DUE TO SEVERE CRITICAL ILLNESS        PHYSICAL EXAMINATION:  GENERAL:critically ill appearing, +resp distress HEAD: Normocephalic, atraumatic.  EYES: Pupils equal, round, reactive to light.  No scleral icterus.  MOUTH: Moist mucosal membrane. NECK: Supple.  PULMONARY: +rhonchi, +wheezing CARDIOVASCULAR: S1 and S2. Regular rate and rhythm. No murmurs, rubs, or gallops.  GASTROINTESTINAL: Soft, nontender, -distended.  Positive bowel sounds.   MUSCULOSKELETAL: + edema.  NEUROLOGIC: obtunded SKIN:intact,warm,dry  MEDICATIONS: I have reviewed all medications and confirmed regimen as documented   CULTURE RESULTS   Recent Results (from the past 240 hour(s))  Respiratory Panel by RT PCR (Flu A&B, Covid) - Nasopharyngeal Swab     Status: None   Collection Time: 01/13/2020  2:04 AM   Specimen: Nasopharyngeal Swab  Result Value Ref Range Status   SARS Coronavirus 2 by RT PCR NEGATIVE NEGATIVE Final    Comment: (NOTE) SARS-CoV-2 target nucleic acids are NOT DETECTED. The SARS-CoV-2 RNA is generally detectable in upper respiratoy specimens during the acute phase of infection. The lowest concentration of SARS-CoV-2 viral copies this assay can detect is 131 copies/mL. A negative  result does not preclude SARS-Cov-2 infection and should not be used as the sole basis for treatment or other patient management decisions. A negative result may occur with  improper specimen collection/handling, submission of specimen other than nasopharyngeal swab, presence of viral mutation(s) within the areas targeted by this assay, and inadequate number of viral copies (<131 copies/mL). A negative result must be combined with clinical observations, patient history, and epidemiological information. The expected result is Negative. Fact Sheet for Patients:  PinkCheek.be Fact Sheet for Healthcare Providers:  GravelBags.it This test is not yet ap proved or cleared by the [place] FDA and  has been authorized for detection and/or diagnosis of SARS-CoV-2 by FDA under an Emergency Use Authorization (EUA). This EUA will remain  in effect (meaning this test can be used) for the duration of the COVID-19 declaration under Section 564(b)(1) of the Act, 21 U.S.C. section 360bbb-3(b)(1), unless the authorization is terminated or revoked sooner.    Influenza A by PCR NEGATIVE NEGATIVE Final   Influenza B by PCR NEGATIVE NEGATIVE Final    Comment: (NOTE) The Xpert Xpress SARS-CoV-2/FLU/RSV assay is intended as an aid in  the diagnosis of influenza from Nasopharyngeal swab specimens and  should not be used as a sole basis for treatment. Nasal washings and  aspirates are unacceptable for Xpert Xpress SARS-CoV-2/FLU/RSV  testing. Fact Sheet for Patients: PinkCheek.be Fact Sheet for Healthcare Providers: GravelBags.it This test is not yet approved or cleared by the [place] FDA  and  has been authorized for detection and/or diagnosis of SARS-CoV-2 by  FDA under an Emergency Use Authorization (EUA). This EUA will remain  in effect (meaning this test can be used) for the  duration of the  Covid-19 declaration under Section 564(b)(1) of the Act, 21  U.S.C. section 360bbb-3(b)(1), unless the authorization is  terminated or revoked. Performed at [facility], [address] [place], [place] [address]   MRSA PCR Screening     Status: None   Collection Time: 02-08-2020  3:06 PM   Specimen: Nasopharyngeal  Result Value Ref Range Status   MRSA by PCR NEGATIVE NEGATIVE Final    Comment:        The GeneXpert MRSA Assay (FDA approved for NASAL specimens only), is one component of a comprehensive MRSA colonization surveillance program. It is not intended to diagnose MRSA infection nor to guide or monitor treatment for MRSA infections. Performed at [facility], [address] [place], [place] [address]           IMAGING    DG Chest Port 1 View  Result Date: 01/25/2020 CLINICAL DATA:  Acute hypoxic respiratory failure EXAM: PORTABLE CHEST 1 VIEW COMPARISON:  Yesterday FINDINGS: Low volume chest which accentuates cardiomegaly. Hazy appearance at the bases from atelectasis and small pleural effusions as seen by CT. No convincing change from prior. Extensive arterial calcification. IMPRESSION: Cardiomegaly with atelectasis and small pleural effusions. No change from chest CT yesterday. Electronically Signed   By: Marnee Spring M.D.   On: 01/15/2020 05:29       ASSESSMENT AND PLAN SYNOPSIS   Sudden change in cardiac and resp status Acute systolic cardiac failure  Patient is dying process. Daughter at bedside updated     Critical Care Time devoted to patient care services described in this note is 43 minutes.   Overall, patient is critically ill, prognosis is guarded.  Patient with Multiorgan failure and at high risk for cardiac arrest and death.    Lucie Leather, M.D.  [facility]  Medical Director [facility] Medical Director [facility] Cardio-Pulmonary Department

## 2020-01-26 NOTE — Progress Notes (Addendum)
   2020/01/31 1509  Clinical Encounter Type  Visited With Patient and family together  Visit Type Follow-up  Referral From Nurse  Consult/Referral To Chaplain  Chaplain responded to page and when she arrived to the room, one of patient's daughters was at bedside. Patient was unresponsive. Chaplain asked if she could pray and daughter said yes. Daughter also said that patient was saying Ps 23 in her sleep, therefore chaplain and daughter repeated the Psalms together and chaplain prayed. Daughter said that her sister was on her way back. When Nurse Martie Lee entered the room and saws patient's current state, she was visibly upset and chaplain walked out of the room with her asking if she was ok. She said no because when she went to lunch, Mrs. Whitelaw was alert and talking. Nurse Martie Lee asked it patient liked music and daughter said that hymns would be nice. Martie Lee turned music on and within less than twenty-five minutes patients transitioned. Sisters asked Camila Li to tell Syliva Overman thank you for everything.

## 2020-01-26 NOTE — Progress Notes (Signed)
*  PRELIMINARY RESULTS* Echocardiogram 2D Echocardiogram has been performed.  April Barrett 01/29/2020, 9:48 AM

## 2020-01-26 NOTE — Progress Notes (Signed)
Pt's daughters took three rings from pt. One ring was a plain band, second ring was a single diamond and the third ring was a multi-diamond band. Pt also had a nightgown and shoes in the closet. Daughters requested that those items be thrown in the trash. Pt has a sliver watch on left wrist that daughters declined to take.

## 2020-01-26 NOTE — Hospital Course (Signed)
86 y.o. female past medical history significant for DNR/DNI status, CHF (EF 50% on 11/2019), paroxysmal atrial fibrillation not on anticoagulation due to previous GI bleed, CAD, GERD, hypertension, hyperlipidemia, pulmonary hypertension who presents to [facility] ED on 01/05/2020 from [facility] nursing facility due to altered mental status.     Admitted to stepdown early hours of 01/12/2020 with acute respiratory failure with hypoxia secondary to acute on chronic systolic CHF, elevated troponin in setting of demand ischemia vs NSTEMI, and AKI with Hyperkalemia.  [facility] Cardiology consulted.  PCCM admitted patient to stepdown initially.  Patient clinically improved and hemodynamically stable, has not required bipap but could potentially require it at nighttime.  As of afternoon 4/26, requiring 3-4 L/min nasal cannula oxygen.  Assumed care of patient on 4/27.  Patient seen with two daughters at bedside in the AM.  Patient was asleep but woke up during questions and exam.  Later in the day, patient had a sudden decompensation with bradycardia and worsened hypoxia.  Patient stated to her daughter and to other staff that she was dying.  She was already DNR status, and she declined to be placed on bipap.  Patient and family agreed to pursue full comfort measures.

## 2020-01-26 DEATH — deceased

## 2020-01-31 ENCOUNTER — Ambulatory Visit: Payer: Medicare Other | Admitting: Family

## 2020-02-08 IMAGING — CT CT HEAD WITHOUT CONTRAST
5 of 6 series · 16 of 47 positions shown, 18 images · non-contrast
Comparison: None.

CLINICAL DATA: Status post fall today.  Initial encounter.

EXAM:
CT HEAD WITHOUT CONTRAST
CT CERVICAL SPINE WITHOUT CONTRAST
TECHNIQUE: Multidetector CT imaging of the head and cervical spine was
performed following the standard protocol without intravenous
contrast. Multiplanar CT image reconstructions of the cervical spine
were also generated.

[Series 2: head wo · axial · 0.43mm/px · z∈[-145,-45]mm · 5 of 31 slices shown, 7 images (1 of 2)]
[im 6/31  brain]
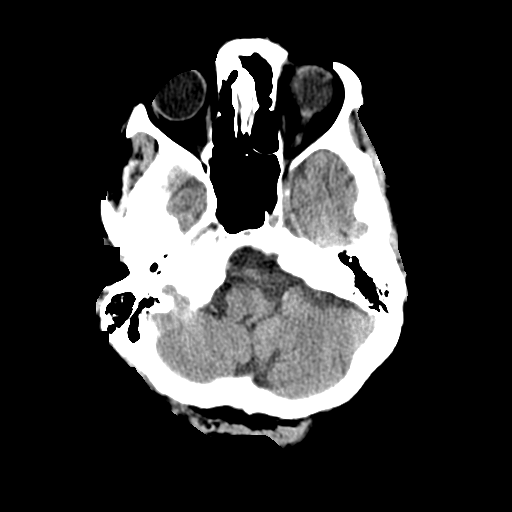
[im 6/31  bone]
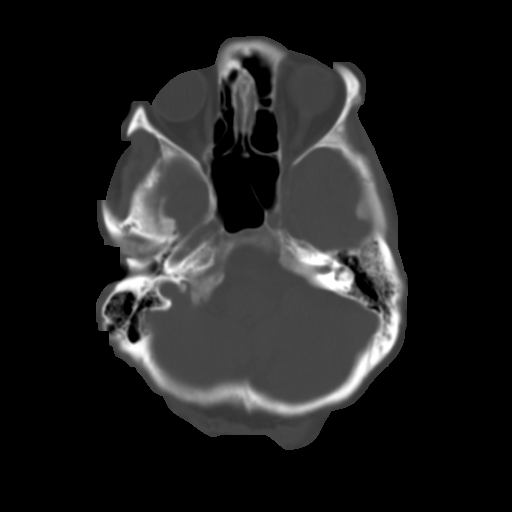
[im 11/31  brain]
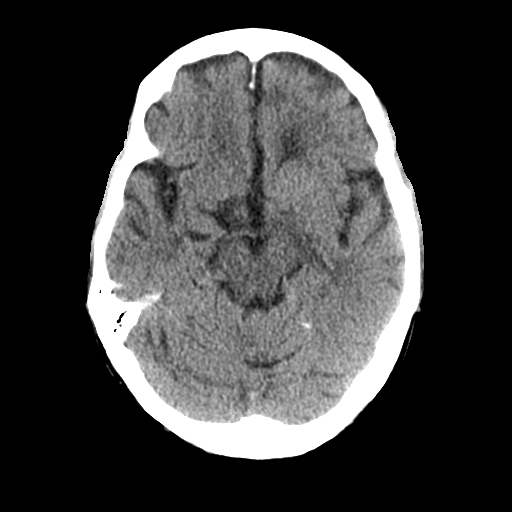
[im 16/31  brain]
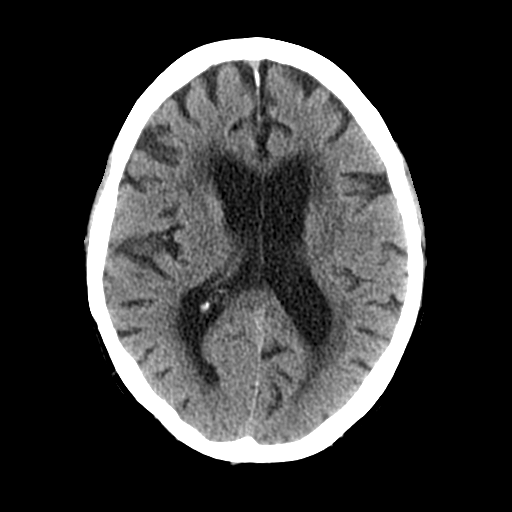
[im 21/31  brain]
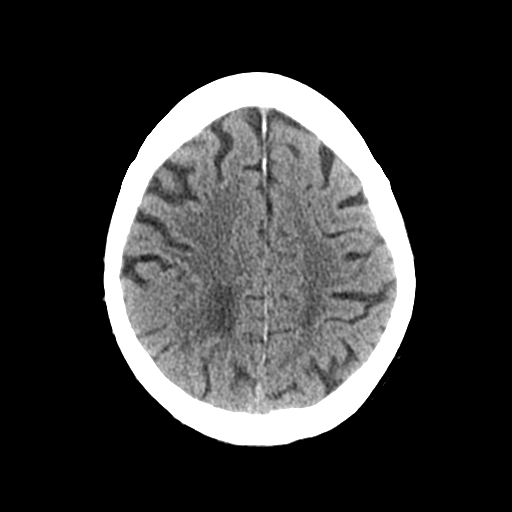
[im 26/31  brain]
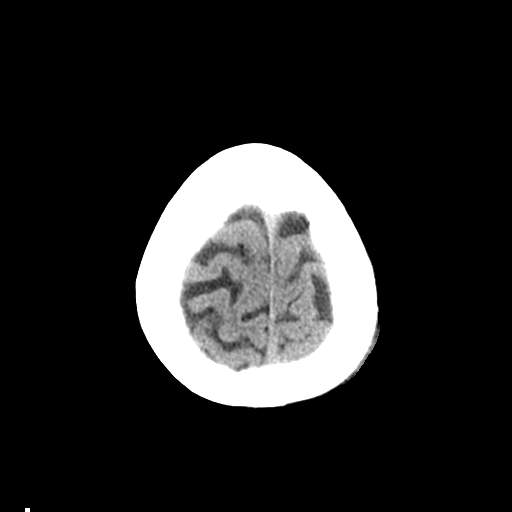
[im 26/31  bone]
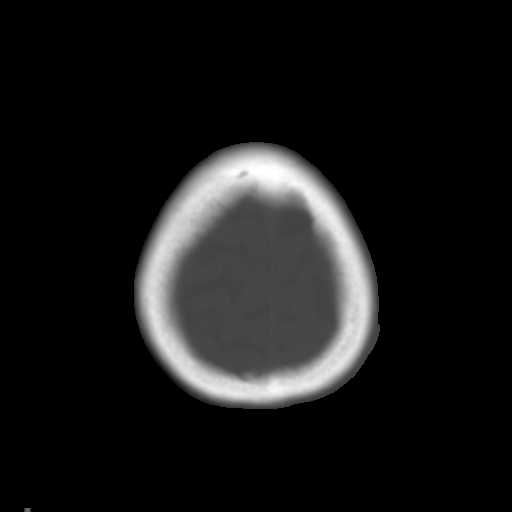

[Series 4: head wo · axial · 0.33mm/px · z∈[-120,-37]mm · 5 of 27 slices shown (2 of 2)]
[im 5/27  brain]
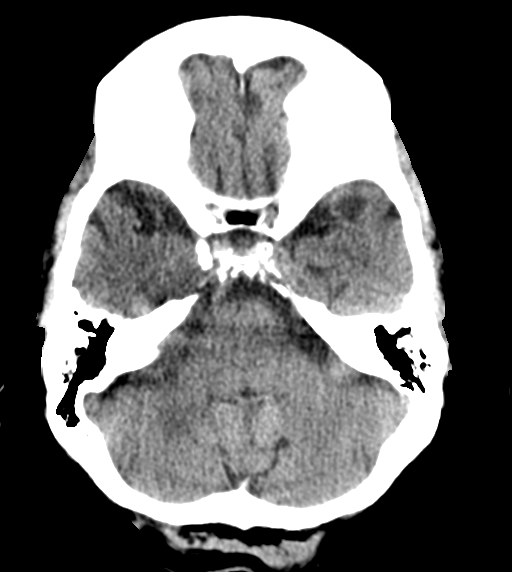
[im 9/27  brain]
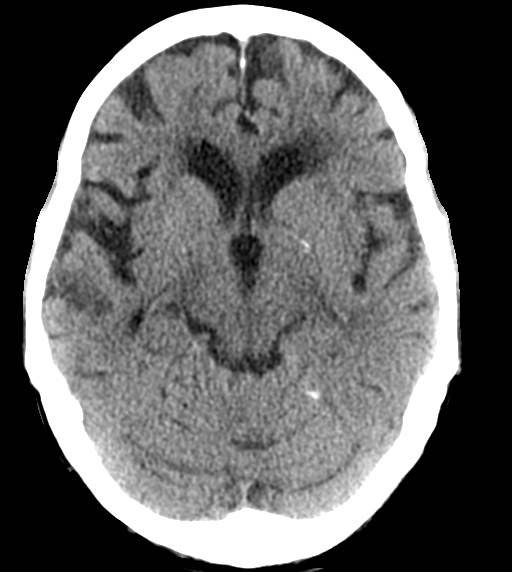
[im 14/27  brain]
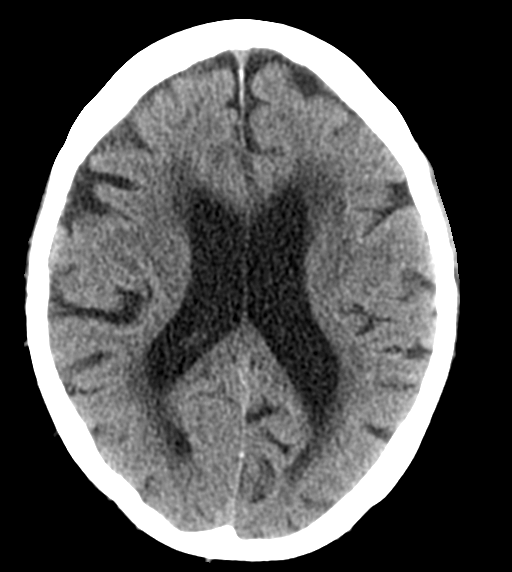
[im 18/27  brain]
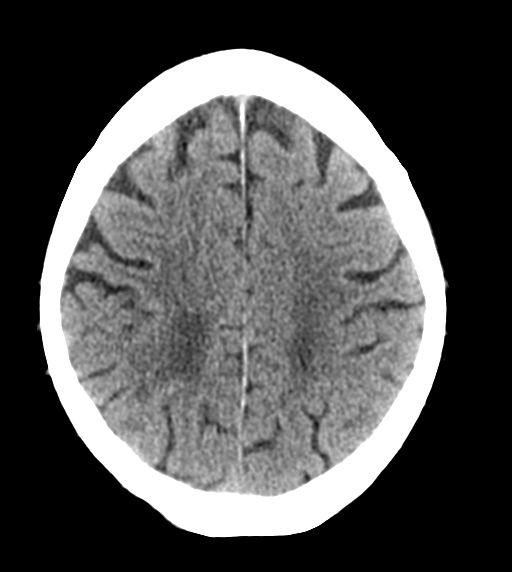
[im 22/27  brain]
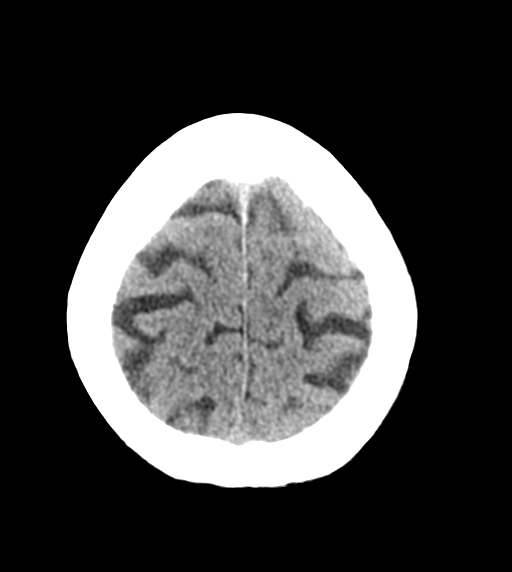

[Series 6: coronal soft tissue · coronal · 0.28mm/px · 3 of 60 slices shown]
[im 20/60  brain]
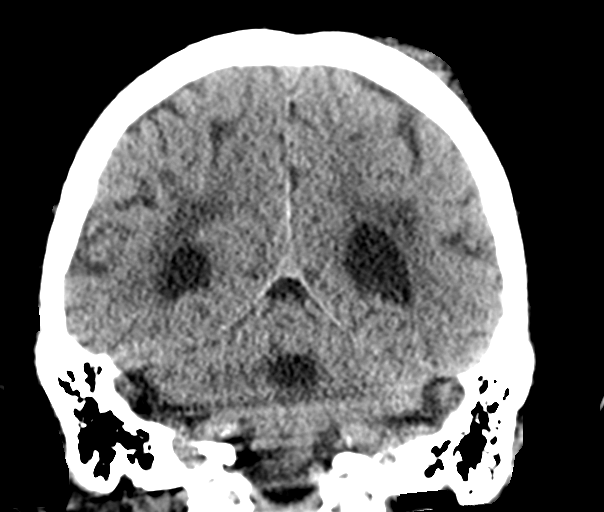
[im 27/60  brain]
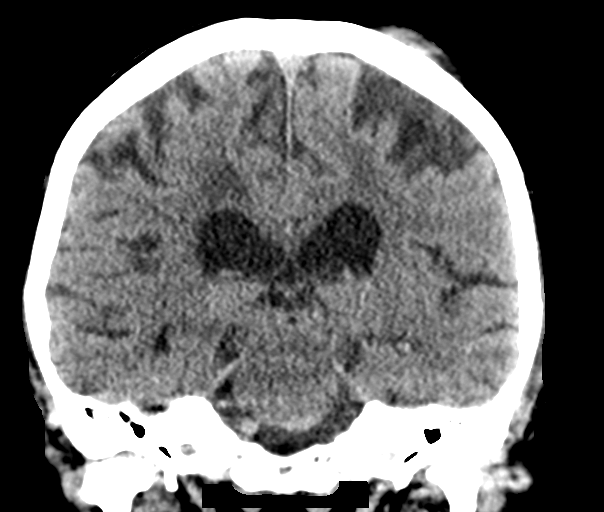
[im 33/60  brain]
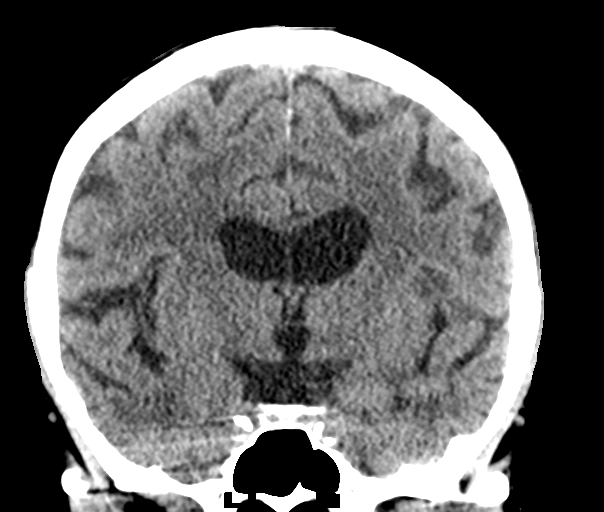

[Series 7: sagittal soft tissue · sagittal · 0.31mm/px · 1 of 51 slices shown]
[im 26/51  brain]
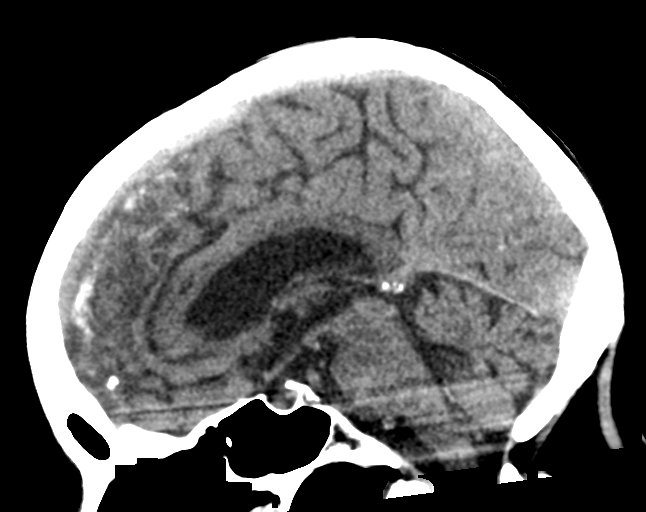

[Series 9: c spine soft · axial · 0.33mm/px · z∈[-279,-263]mm · 2 of 65 slices shown]
[im 5/65  brain]
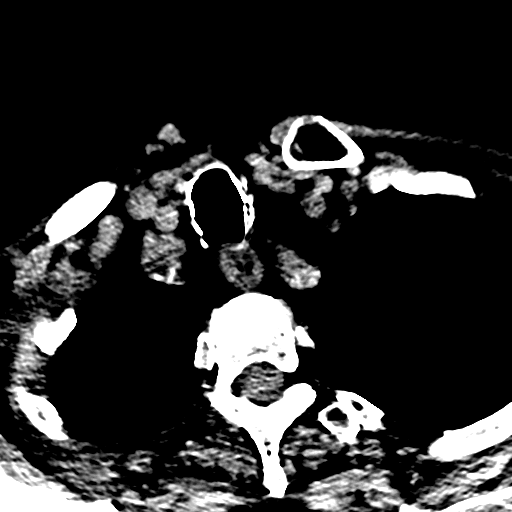
[im 13/65  brain]
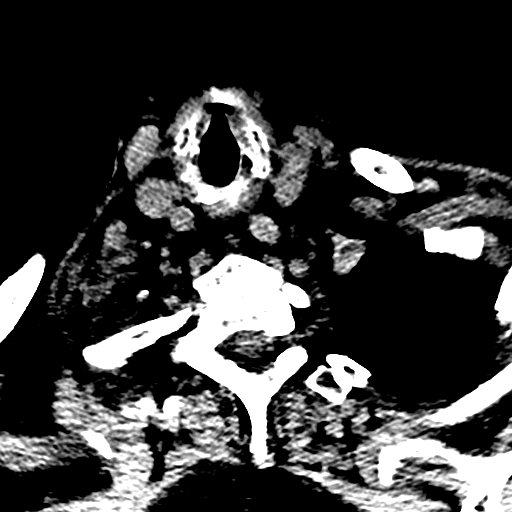

[16 of 47 positions shown; findings below may reference images not displayed]

FINDINGS: CT HEAD FINDINGS

Brain: No evidence of acute infarction, hemorrhage, hydrocephalus,
extra-axial collection or mass lesion/mass effect. Hypoattenuation
in the subcortical and periventricular deep white matter consistent
with chronic microvascular ischemic change is noted.

Vascular: No hyperdense vessel or unexpected calcification.

Skull: Intact.  No focal lesion.

Sinuses/Orbits: Status post cataract surgery.  Otherwise negative.

Other: Scalp contusion posteriorly on the left near the vertex is
noted.

CT CERVICAL SPINE FINDINGS

Alignment: There is reversal of the normal cervical lordosis. Facet
degenerative disease results in 0.3 cm anterolisthesis C4 on C5 and
trace anterolisthesis C7 on T1.

Skull base and vertebrae: No acute fracture. No primary bone lesion
or focal pathologic process.

Soft tissues and spinal canal: Negative.

Disc levels: Loss of disc space height and endplate spurring are
worst at C5-6 and C6-7.

Upper chest: Lung apices clear.

Other: None.
IMPRESSION: Scalp contusion posteriorly on the left near the vertex without
underlying fracture or acute intracranial normality.

No acute abnormality cervical spine.

Atrophy and chronic microvascular ischemic change.

Cervical spondylosis.

## 2020-02-08 IMAGING — CR LUMBAR SPINE - 2-3 VIEW
3 series · 3 of 3 positions shown · non-contrast
Comparison: Pain CT abdomen pelvis 12/12/2013

CLINICAL DATA: Fall, low back pain

EXAM:
LUMBAR SPINE - 2-3 VIEW

[l-spine ap]
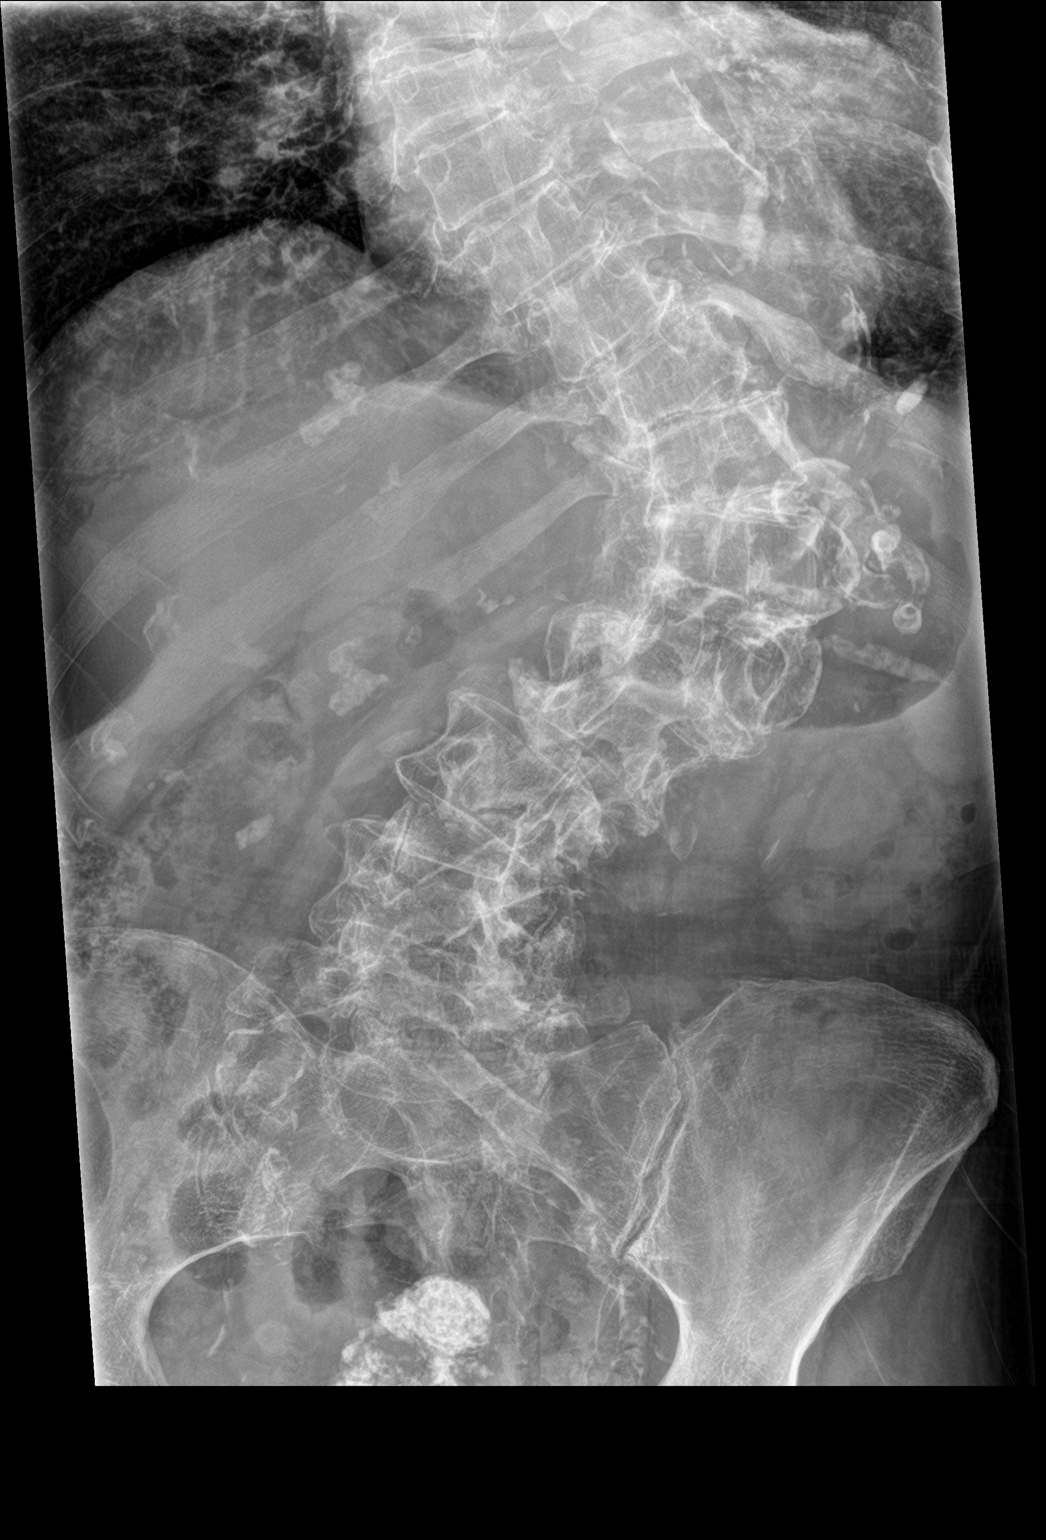

[l-spine lat]
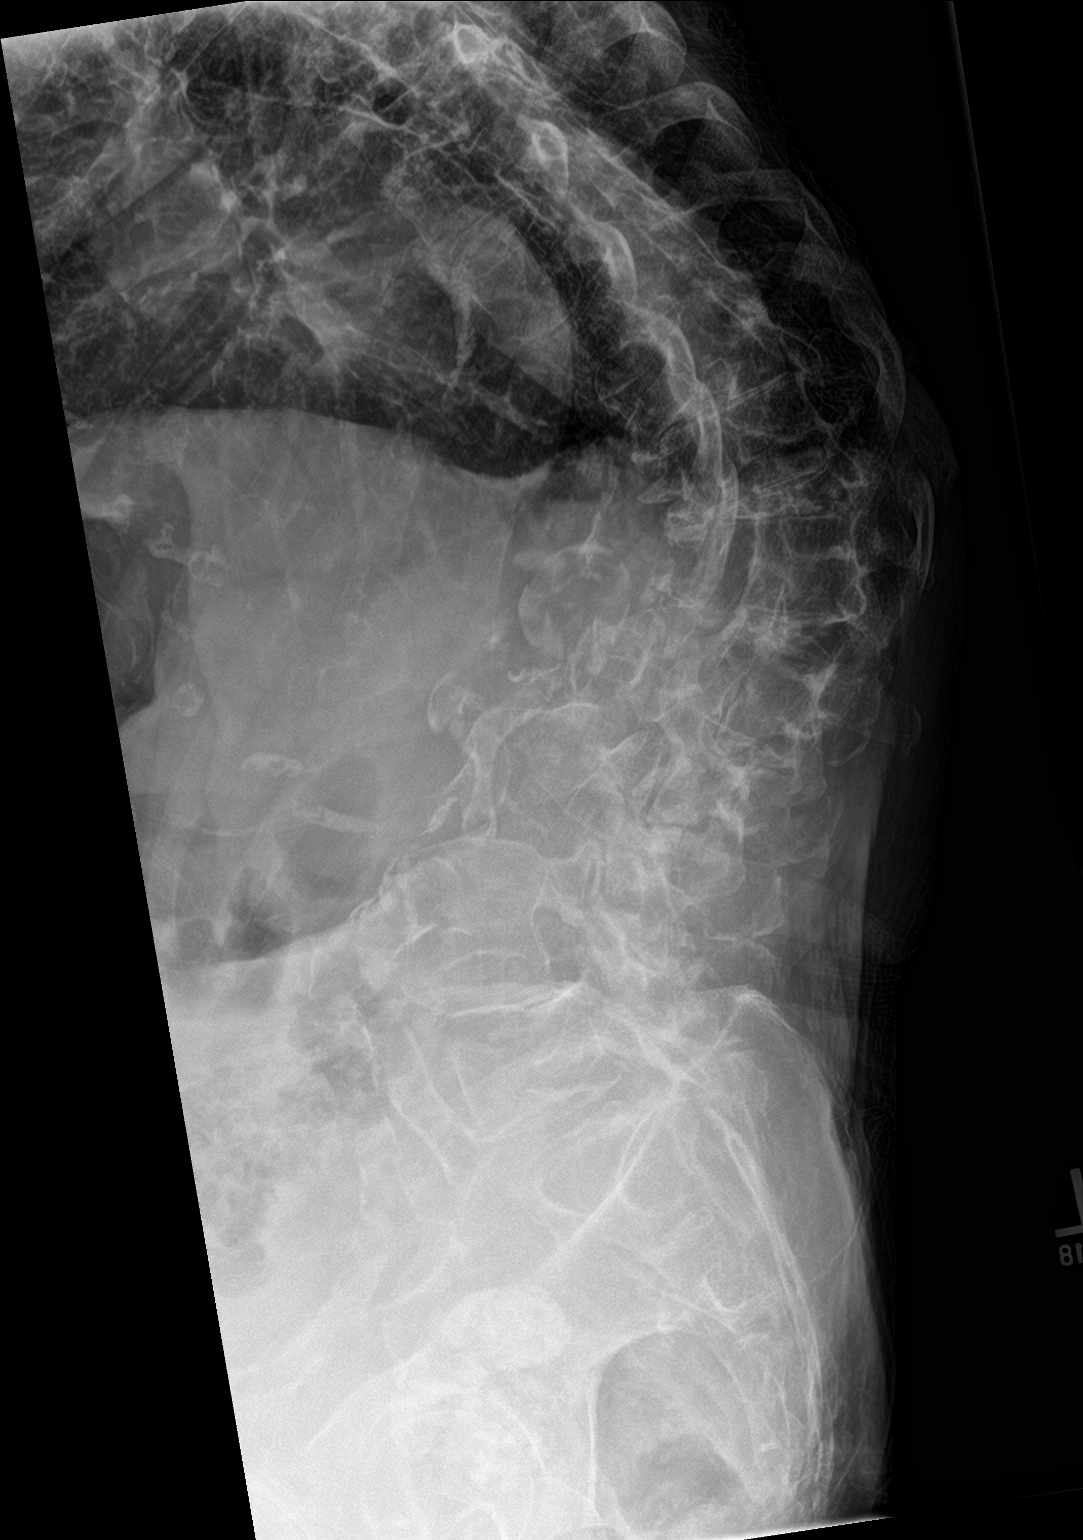

[l-spine spot]
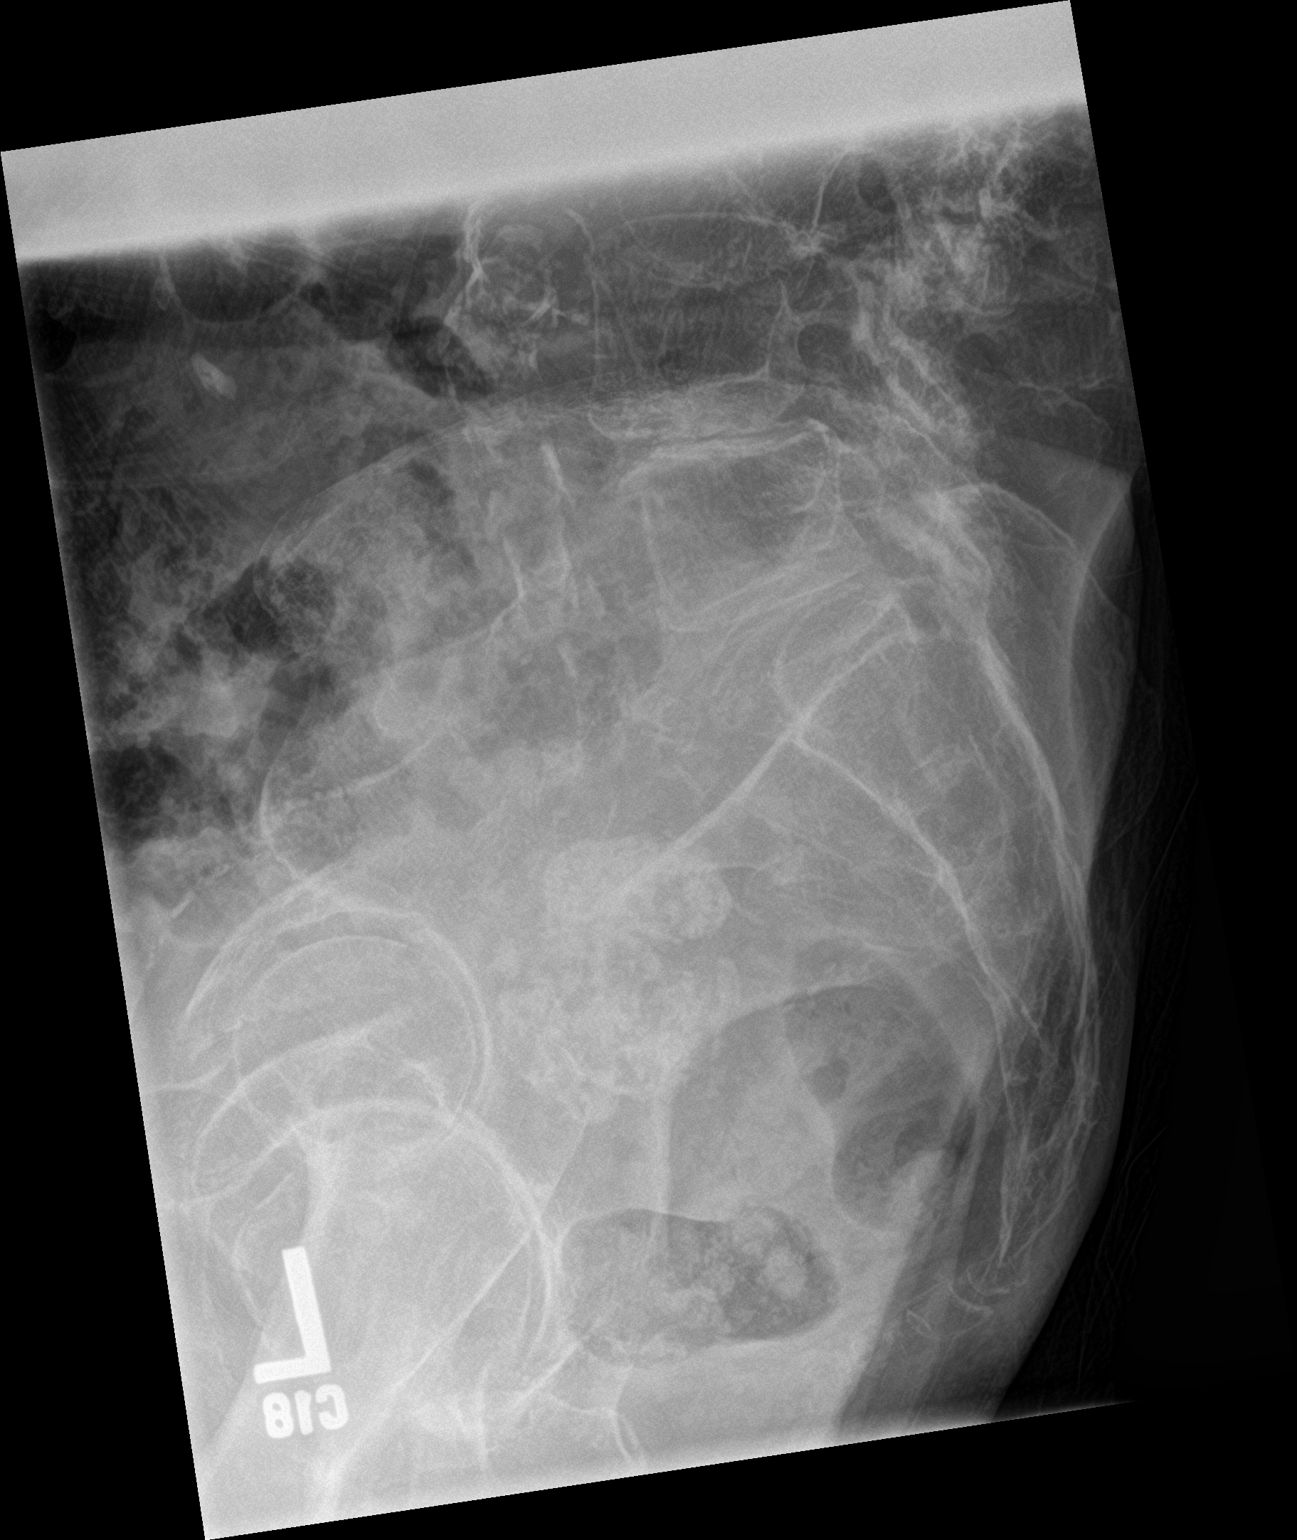

[3 of 3 positions shown; findings below may reference images not displayed]

FINDINGS: Severe levocurvature of the thoracolumbar spine with multilevel
discogenic and facet degenerative changes and diffuse bone
demineralization. These features limit detection of acute traumatic
findings. No definite traumatic abnormality is identified. Coarse
calcifications in the pelvis compatible with fibroid uterus.
Extensive vascular calcifications are noted throughout the abdomen
and pelvis.
IMPRESSION: Limited evaluation for acute traumatic findings given the presence
of bony demineralization, severe scoliotic curvature, and multilevel
degenerative changes. Recommend low threshold for CT imaging if
there is persisting clinical concern.

## 2020-02-08 IMAGING — CT CT THORACIC SPINE WITHOUT CONTRAST
3 series · 9 of 34 positions shown, 10 images · non-contrast
Comparison: Thoracic spine radiographs 05/21/2019

CLINICAL DATA: Fall.  Back pain

EXAM:
CT THORACIC SPINE WITHOUT CONTRAST
TECHNIQUE: Multidetector CT images of the thoracic were obtained using the
standard protocol without intravenous contrast.

[Series 5: t spine soft · axial · 0.44mm/px · z∈[-200,-200]mm · 1 of 139 slices shown, 2 images]
[im 75/139  soft-tissue]
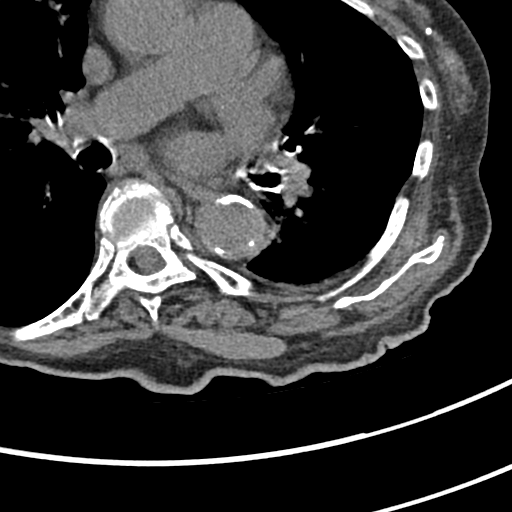
[im 75/139  bone]
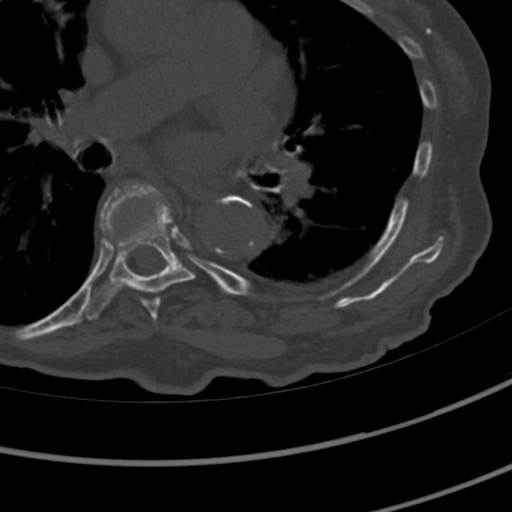

[Series 7: sagittal bone · sagittal · 0.31mm/px · 5 of 56 slices shown]
[im 19/56  bone]
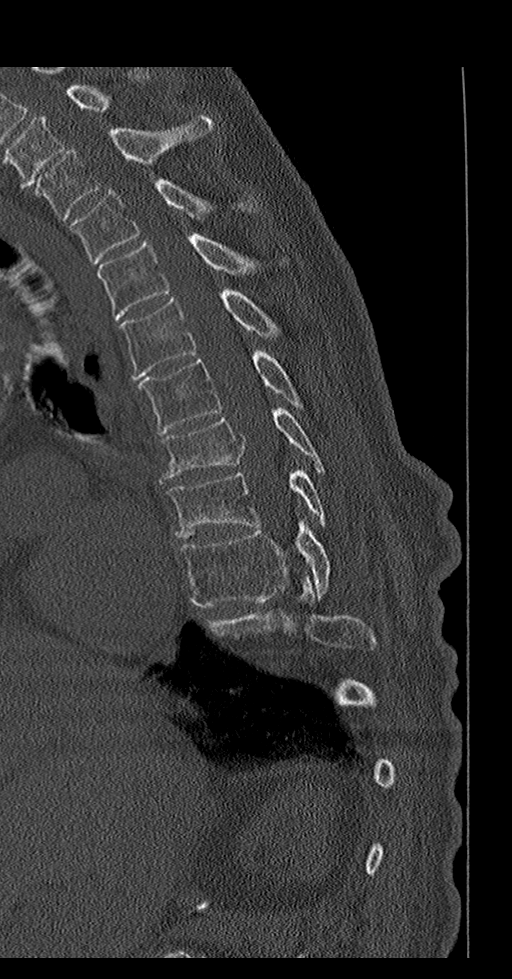
[im 23/56  bone]
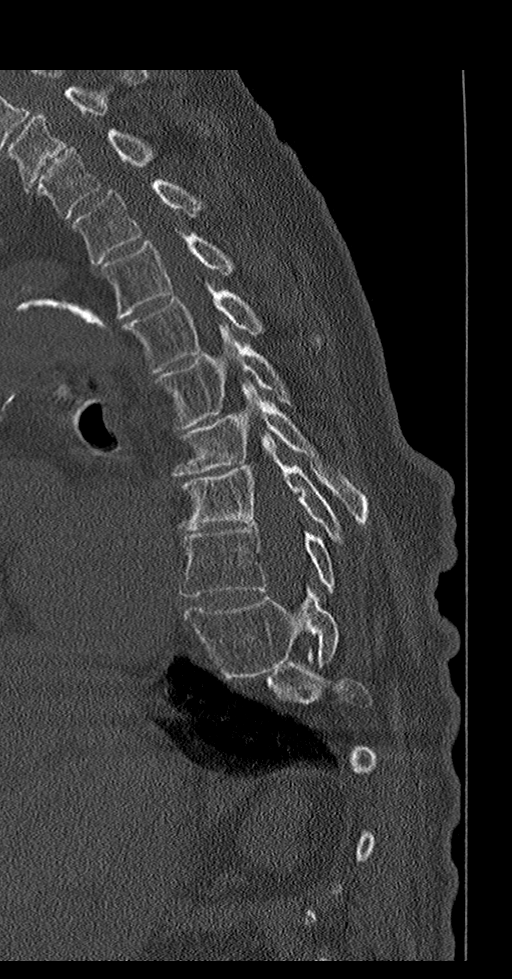
[im 28/56  bone]
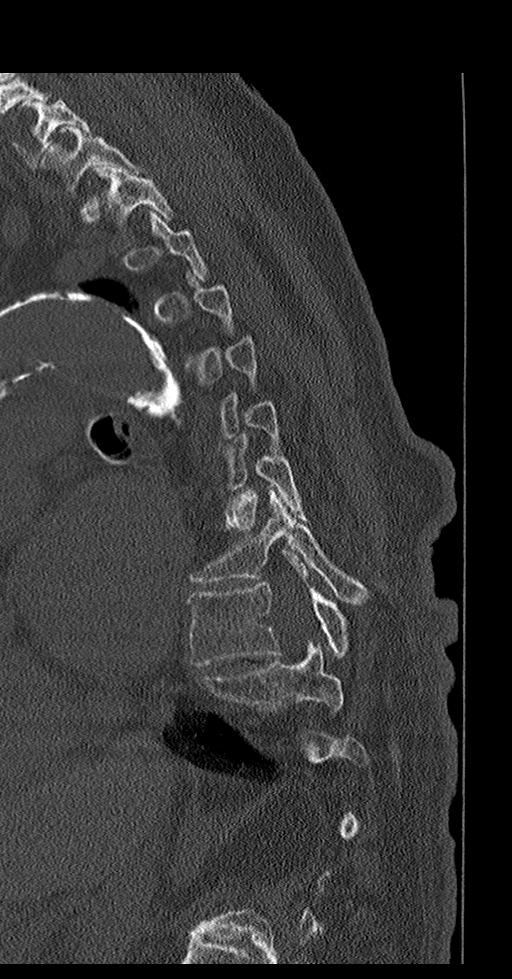
[im 33/56  bone]
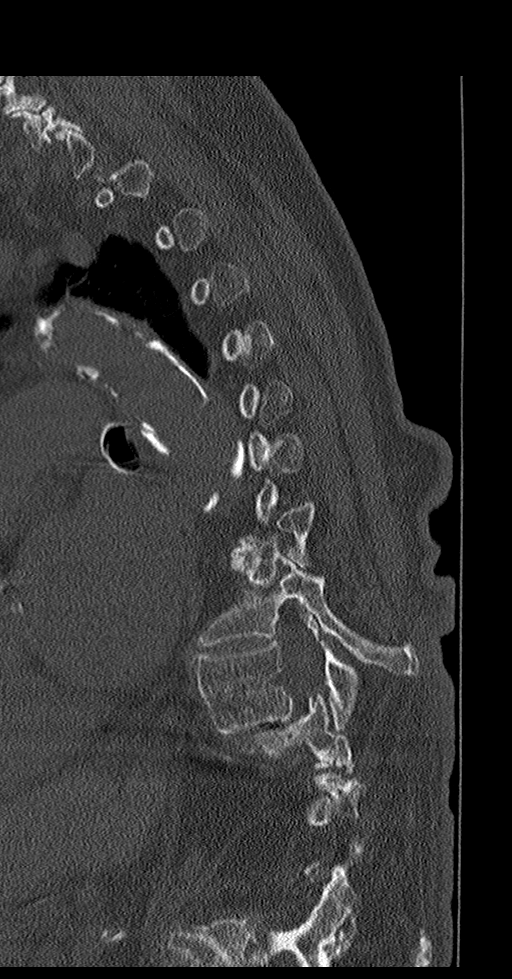
[im 37/56  bone]
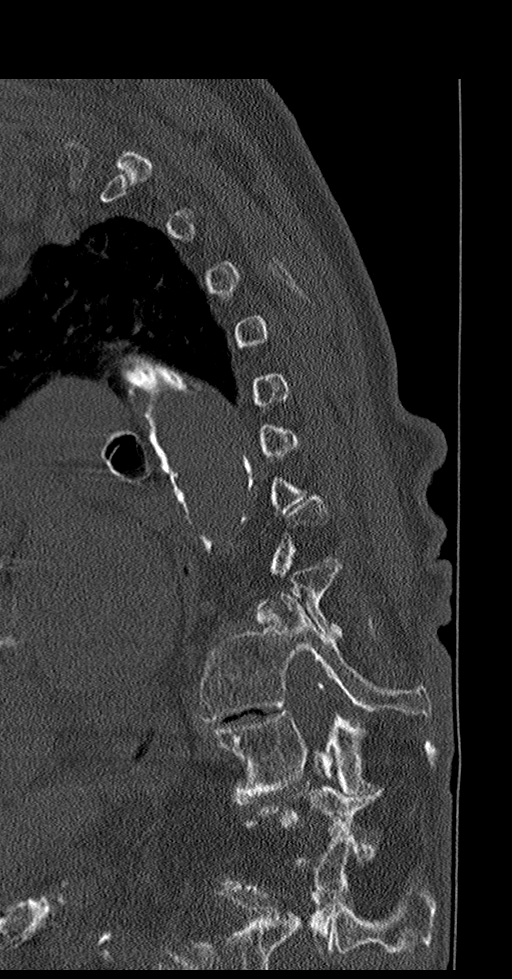

[Series 8: coronal bone · coronal · 0.22mm/px · 3 of 81 slices shown]
[im 17/81  bone]
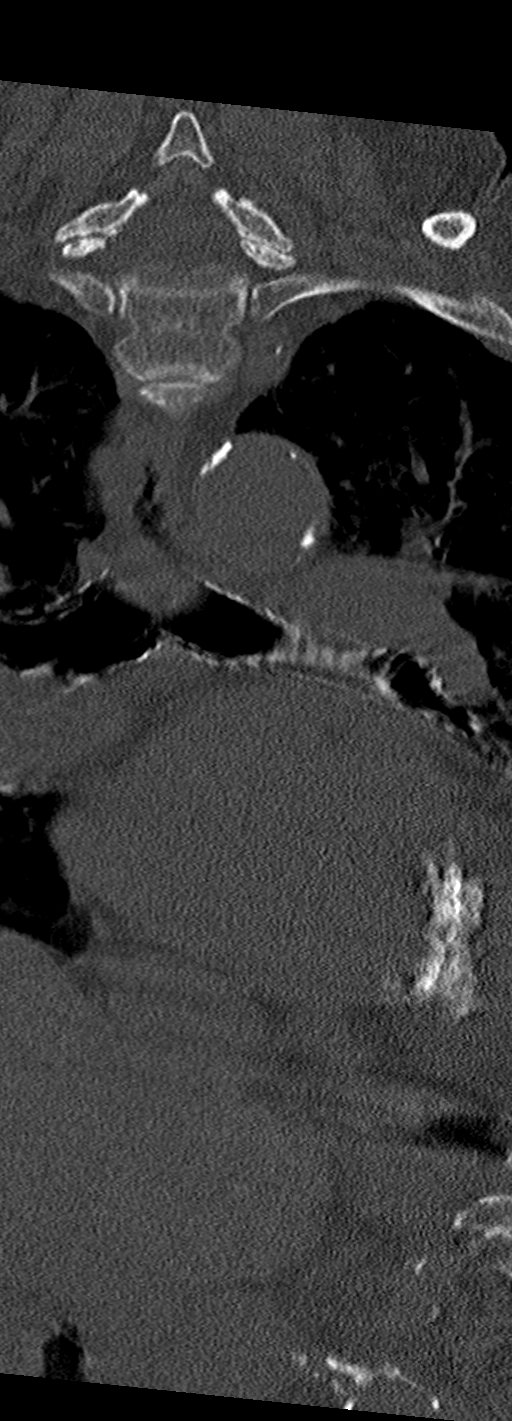
[im 33/81  bone]
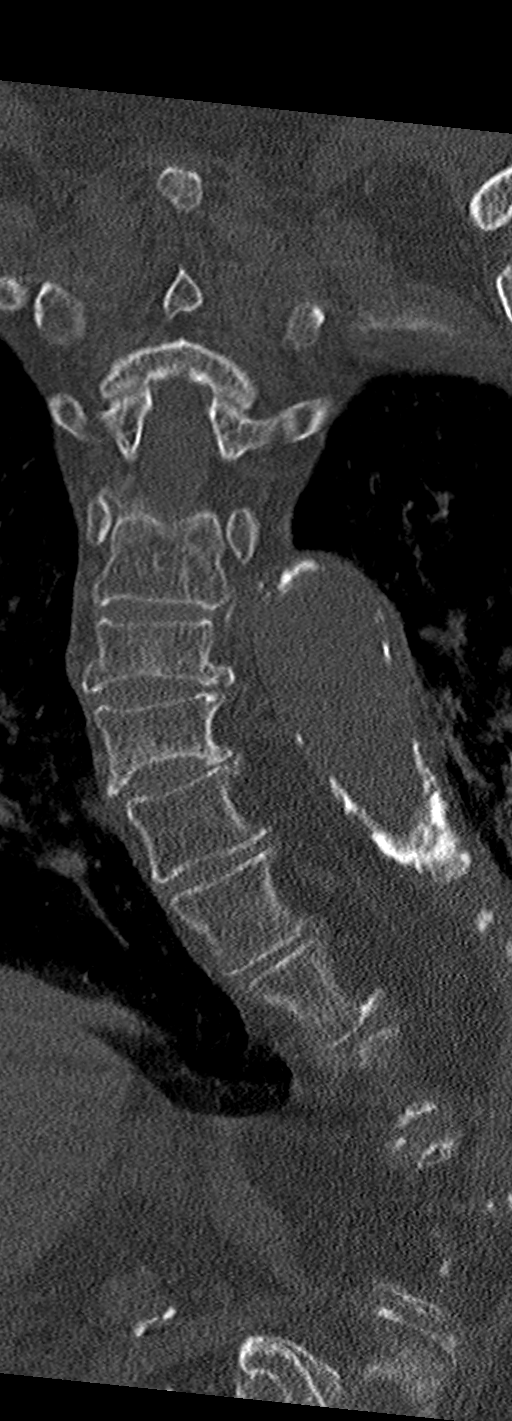
[im 49/81  bone]
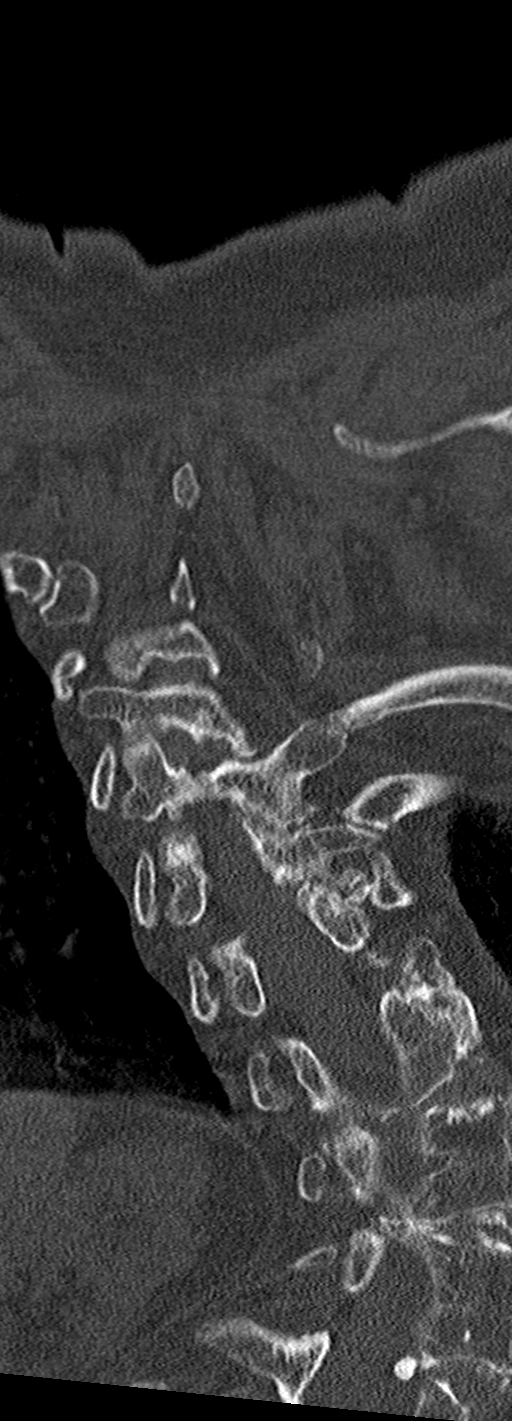

[9 of 34 positions shown; findings below may reference images not displayed]

FINDINGS: Alignment: Marked levoscoliosis at approximately T12 measuring
approximately 64 degrees. Normal sagittal alignment.

Vertebrae: Mild inferior endplate fractures of T5 and T6 appear
acute. Mild retropulsion of bone into the canal of posterior cortex
of T5. This is not causing significant spinal stenosis. No posterior
element fracture. No other fracture or vertebral body mass.

Paraspinal and other soft tissues: Negative for paraspinous mass or
hematoma

Extensive atherosclerotic calcification throughout the thoracic
aorta. Extensive coronary calcification. Cardiac enlargement.

Lungs are clear without infiltrate or effusion.

Disc levels: Mild disc and facet degeneration in the thoracic spine
without significant spinal stenosis.
IMPRESSION: Mild inferior endplate fractures of T5 and T6 which appear acute.
Mild retropulsion of posterior cortex of T5 into the canal without
causing significant spinal stenosis.

Advanced atherosclerotic disease aorta and coronary arteries

Marked scoliosis convex to  the left T12 approximately 64 degrees.

## 2020-02-26 NOTE — Death Summary Note (Signed)
DEATH SUMMARY   Patient Details  Name: April Barrett MRN: [id number] DOB: 1928-05-31  Admission/Discharge Information   Admit Date:  02/11/20  Date of Death: Date of Death: 02-12-20  Time of Death: Time of Death: 02/01/1545  Length of Stay: 1  Referring Physician: [facility]   Reason(s) for Hospitalization  CARDIAC FAILURE  Diagnoses  Preliminary cause of death: ISCHEMIC CARDIOMYOPATHY Secondary Diagnoses (including complications and co-morbidities):  Active Problems:   CHF (congestive heart failure) [facility])   Brief Hospital Course (including significant findings, care, treatment, and services provided and events leading to death)  Sudden change in cardiac and resp status Acute systolic cardiac failure  Patient is dying process. Daughter at bedside updated   The Clinical status was relayed to family in detail.  Updated and notified of patients medical condition.  Patient remains unresponsive and will not open eyes to command.   Upon assessment his breath sounds are course crackles with significant secretions to his oral pharyngeal region.  Nasopharyngeal suction produced copious sanguineous secretions.  Patient is having a weak cough and struggling to remove secretions.  patient with increased WOB and using accessory muscles to breathe Explained to family course of therapy and the modalities     Patient with Progressive multiorgan failure with very low chance of meaningful recovery despite all aggressive and optimal medical therapy.  Patient is in the dying  Process associated with suffering.  Family understands the situation.  They have consented and agreed to DNR/DNI and would like to proceed with Comfort care measures.  Family are satisfied with Plan of action and management. All questions answered     Pertinent Labs and Studies  Significant Diagnostic Studies CT Angio Chest PE W and/or Wo Contrast  Result Date: 2020-02-11 CLINICAL DATA:  Hypoxia  EXAM: CT ANGIOGRAPHY CHEST WITH CONTRAST TECHNIQUE: Multidetector CT imaging of the chest was performed using the standard protocol during bolus administration of intravenous contrast. Multiplanar CT image reconstructions and MIPs were obtained to evaluate the vascular anatomy. CONTRAST:  87mL OMNIPAQUE IOHEXOL 350 MG/ML SOLN COMPARISON:  02/17/2014 FINDINGS: Cardiovascular: Cardiomegaly. No pericardial effusion. Limited pulmonary artery opacification due to early bolus and respiratory motion. No central/lobar embolism is seen when accounting for mixing artifact at the levels of the left and right pulmonary arteries. There is renal failure such that automatic repeat was not performed. Heavily calcified aorta without dilatation or visible intramural hematoma. Coronary atherosclerosis. Mediastinum/Nodes: No adenopathy or pneumomediastinum. Lungs/Pleura: Mild streaky opacity most consistent with atelectasis. Trace pleural effusions. No pulmonary edema or consolidation. Upper Abdomen: Extensive atherosclerotic calcification. Musculoskeletal: Severe spinal degeneration and scoliosis. Remote appearing T3 superior endplate and T5 compression fractures. Remote left posterior rib fractures. Review of the MIP images confirms the above findings. IMPRESSION: 1. Very limited PE study, no embolism seen to the level of the main pulmonary arteries. 2. Cardiomegaly without failure. 3.  Aortic Atherosclerosis (ICD10-I70.0) that is extensive. 4. Nonacute T3 and T5 compression fractures. Electronically Signed   By: Marnee Spring M.D.   On: Feb 11, 2020 04:18   US RENAL  Result Date: 11-Feb-2020 CLINICAL DATA:  Acute kidney injury EXAM: RENAL / URINARY TRACT ULTRASOUND COMPLETE COMPARISON:  Ultrasound 02/17/2014 FINDINGS: Right Kidney: Renal measurements: 7.1 x 3.5 x 3.9 cm = volume: 51 mL. Parenchyma isoechoic to the adjacent liver. Mild diffuse parenchymal thinning. No mass or hydronephrosis visualized. Left Kidney: Renal  measurements: 9.7 x 4.7 x 3.9 cm = volume: 93 mL. Echogenicity within normal limits. No mass or hydronephrosis visualized.  Bladder: Appears normal for degree of bladder distention. Other: None. IMPRESSION: Bilateral renal parenchymal atrophy.  No hydronephrosis. Electronically Signed   By: Corlis Leak M.D.   On: Feb 07, 2020 09:04   DG Chest Port 1 View  Result Date: 01/13/2020 CLINICAL DATA:  Acute hypoxic respiratory failure EXAM: PORTABLE CHEST 1 VIEW COMPARISON:  Yesterday FINDINGS: Low volume chest which accentuates cardiomegaly. Hazy appearance at the bases from atelectasis and small pleural effusions as seen by CT. No convincing change from prior. Extensive arterial calcification. IMPRESSION: Cardiomegaly with atelectasis and small pleural effusions. No change from chest CT yesterday. Electronically Signed   By: Marnee Spring M.D.   On: 12/28/2019 05:29   DG Chest Portable 1 View  Result Date: Feb 07, 2020 CLINICAL DATA:  Hypoxia. EXAM: PORTABLE CHEST 1 VIEW COMPARISON:  Feb 15, 2014 FINDINGS: Decreased lung volumes are seen which is likely, in part secondary to suboptimal patient inspiration. Mild, stable chronic appearing increased lung markings are noted bilaterally. A small left pleural effusion is seen. No pneumothorax is identified. The cardiac silhouette is moderately enlarged and unchanged in size. There is marked severity calcification of the thoracic aorta. Multilevel degenerative changes seen throughout the thoracic spine. IMPRESSION: Chronic appearing increased lung markings with a small left pleural effusion. Electronically Signed   By: Aram Candela M.D.   On: Feb 07, 2020 02:07   ECHOCARDIOGRAM COMPLETE  Result Date: 01/23/2020    ECHOCARDIOGRAM REPORT   Patient Name:   April Barrett Date of Exam: 01/12/2020 Medical Rec #:  [id number]         Height:       63.0 in Accession #:    [id number]        Weight:       114.6 lb Date of Birth:  [birth year]         BSA:          1.526 m  Patient Age:    86 years          BP:           115/88 mmHg Patient Gender: F                 HR:           119 bpm. Exam Location:  [facility] Procedure: 2D Echo, Color Doppler and Cardiac Doppler Indications:     I50.21 CHF-Acute Systolic  History:         Patient has no prior history of Echocardiogram examinations.                  CAD; Risk Factors:Dyslipidemia and Hypertension.  Sonographer:     Humphrey Rolls RDCS (AE) Referring Phys:  [id number] Judithe Modest Diagnosing Phys: Alwyn Pea MD  Sonographer Comments: No subcostal window. IMPRESSIONS  1. Left ventricular ejection fraction, by estimation, is <20%. The left ventricle has severely decreased function. The left ventricle demonstrates global hypokinesis. Left ventricular diastolic parameters are consistent with Grade I diastolic dysfunction (impaired relaxation).  2. Right ventricular systolic function is moderately reduced. The right ventricular size is mildly enlarged. There is mildly elevated pulmonary artery systolic pressure.  3. Left atrial size was mild to moderately dilated.  4. Right atrial size was mild to moderately dilated.  5. The mitral valve is myxomatous. Mild mitral valve regurgitation.  6. Tricuspid valve regurgitation is moderate to severe.  7. The aortic valve is grossly normal. Aortic valve regurgitation is mild. Severe aortic valve stenosis. FINDINGS  Left Ventricle: Left  ventricular ejection fraction, by estimation, is <20%. The left ventricle has severely decreased function. The left ventricle demonstrates global hypokinesis. The left ventricular internal cavity size was normal in size. There is no  left ventricular hypertrophy. Left ventricular diastolic parameters are consistent with Grade I diastolic dysfunction (impaired relaxation). Right Ventricle: The right ventricular size is mildly enlarged. No increase in right ventricular wall thickness. Right ventricular systolic function is moderately reduced. There is mildly elevated  pulmonary artery systolic pressure. The tricuspid regurgitant velocity is 2.71 m/s, and with an assumed right atrial pressure of 10 mmHg, the estimated right ventricular systolic pressure is 39.4 mmHg. Left Atrium: Left atrial size was mild to moderately dilated. Right Atrium: Right atrial size was mild to moderately dilated. Pericardium: There is no evidence of pericardial effusion. Mitral Valve: The mitral valve is myxomatous. Mild mitral valve regurgitation. MV peak gradient, 4.8 mmHg. The mean mitral valve gradient is 2.0 mmHg. Tricuspid Valve: The tricuspid valve is grossly normal. Tricuspid valve regurgitation is moderate to severe. Aortic Valve: The aortic valve is grossly normal. Aortic valve regurgitation is mild. Aortic regurgitation PHT measures 593 msec. Severe aortic stenosis is present. Aortic valve mean gradient measures 10.0 mmHg. Aortic valve peak gradient measures 18.5 mmHg. Aortic valve area, by VTI measures 0.49 cm. Pulmonic Valve: The pulmonic valve was normal in structure. Pulmonic valve regurgitation is not visualized. Aorta: The aortic root is normal in size and structure. IAS/Shunts: No atrial level shunt detected by color flow Doppler.  LEFT VENTRICLE PLAX 2D LVIDd:         4.16 cm  Diastology LVIDs:         3.81 cm  LV e' lateral:   8.92 cm/s LV PW:         0.80 cm  LV E/e' lateral: 11.3 LV IVS:        0.96 cm LVOT diam:     1.70 cm LV SV:         14 LV SV Index:   9 LVOT Area:     2.27 cm  RIGHT VENTRICLE RV Basal diam:  3.18 cm LEFT ATRIUM             Index       RIGHT ATRIUM           Index LA diam:        4.00 cm 2.62 cm/m  RA Area:     19.20 cm LA Vol (A2C):   57.4 ml 37.61 ml/m RA Volume:   50.10 ml  32.82 ml/m LA Vol (A4C):   92.7 ml 60.73 ml/m LA Biplane Vol: 75.1 ml 49.20 ml/m  AORTIC VALVE                    PULMONIC VALVE AV Area (Vmax):    0.57 cm     PV Vmax:       0.72 m/s AV Area (Vmean):   0.50 cm     PV Vmean:      47.100 cm/s AV Area (VTI):     0.49 cm     PV  VTI:        0.102 m AV Vmax:           215.00 cm/s  PV Peak grad:  2.1 mmHg AV Vmean:          151.000 cm/s PV Mean grad:  1.0 mmHg AV VTI:            0.297 m AV  Peak Grad:      18.5 mmHg AV Mean Grad:      10.0 mmHg LVOT Vmax:         53.90 cm/s LVOT Vmean:        33.400 cm/s LVOT VTI:          0.064 m LVOT/AV VTI ratio: 0.21 AI PHT:            593 msec  AORTA Ao Root diam: 2.90 cm MITRAL VALVE                TRICUSPID VALVE MV Area (PHT): 4.94 cm     TR Peak grad:   29.4 mmHg MV Peak grad:  4.8 mmHg     TR Vmax:        271.00 cm/s MV Mean grad:  2.0 mmHg MV Vmax:       1.10 m/s     SHUNTS MV Vmean:      56.9 cm/s    Systemic VTI:  0.06 m MV Decel Time: 154 msec     Systemic Diam: 1.70 cm MV E velocity: 100.47 cm/s Yolonda Kida MD Electronically signed by Yolonda Kida MD Signature Date/Time: 01/23/2020/6:59:34 AM    Final    US Abdomen Limited RUQ  Result Date: 01/10/2020 CLINICAL DATA:  Elevated LFTs EXAM: ULTRASOUND ABDOMEN LIMITED RIGHT UPPER QUADRANT COMPARISON:  CT abdomen 12/12/2013 FINDINGS: Gallbladder: No gallstones or wall thickening visualized. Incompletely distended. No sonographic Murphy sign noted by sonographer. Common bile duct: Diameter: 2.5 mm, unremarkable Liver: No focal lesion identified. Within normal limits in parenchymal echogenicity. Portal vein is patent on color Doppler imaging with normal direction of blood flow towards the liver. Other: Bilateral pleural effusions.  Trace perihepatic ascites. IMPRESSION: 1. Normal evaluation of the gallbladder and liver. 2. Bilateral pleural effusions and trace perihepatic ascites. Electronically Signed   By: Lucrezia Europe M.D.   On: 01/25/2020 09:06    Microbiology No results found for this or any previous visit (from the past 240 hour(s)).  Lab Basic Metabolic Panel: No results for input(s): NA, K, CL, CO2, GLUCOSE, BUN, CREATININE, CALCIUM, MG, PHOS in the last 168 hours. Liver Function Tests: No results for input(s): AST, ALT,  ALKPHOS, BILITOT, PROT, ALBUMIN in the last 168 hours. No results for input(s): LIPASE, AMYLASE in the last 168 hours. No results for input(s): AMMONIA in the last 168 hours. CBC: No results for input(s): WBC, NEUTROABS, HGB, HCT, MCV, PLT in the last 168 hours. Cardiac Enzymes: No results for input(s): CKTOTAL, CKMB, CKMBINDEX, TROPONINI in the last 168 hours. Sepsis Labs: No results for input(s): PROCALCITON, WBC, LATICACIDVEN in the last 168 hours.   Flora Lipps 02/08/2020, 7:32 AM
